# Patient Record
Sex: Female | Born: 1935 | Race: White | Hispanic: No | Marital: Single | Smoking: Current every day smoker
Health system: Southern US, Community
[De-identification: ages and names within clinical notes are randomized; demographics above are authoritative.]

## PROBLEM LIST (undated history)

## (undated) DIAGNOSIS — J9610 Chronic respiratory failure, unspecified whether with hypoxia or hypercapnia: Secondary | ICD-10-CM

## (undated) DIAGNOSIS — J449 Chronic obstructive pulmonary disease, unspecified: Secondary | ICD-10-CM

## (undated) DIAGNOSIS — I10 Essential (primary) hypertension: Secondary | ICD-10-CM

## (undated) DIAGNOSIS — I509 Heart failure, unspecified: Secondary | ICD-10-CM

## (undated) DIAGNOSIS — E785 Hyperlipidemia, unspecified: Secondary | ICD-10-CM

## (undated) DIAGNOSIS — H9190 Unspecified hearing loss, unspecified ear: Secondary | ICD-10-CM

## (undated) HISTORY — DX: Heart failure, unspecified: I50.9

## (undated) HISTORY — DX: Unspecified hearing loss, unspecified ear: H91.90

## (undated) HISTORY — PX: HIP SURGERY: SHX245

## (undated) HISTORY — DX: Essential (primary) hypertension: I10

---

## 2010-06-04 ENCOUNTER — Inpatient Hospital Stay: Payer: Self-pay | Admitting: Internal Medicine

## 2012-04-23 ENCOUNTER — Ambulatory Visit: Payer: Self-pay | Admitting: Specialist

## 2013-08-20 ENCOUNTER — Inpatient Hospital Stay: Payer: Self-pay | Admitting: Internal Medicine

## 2013-08-20 LAB — CBC
HCT: 40.1 % (ref 35.0–47.0)
HGB: 12.6 g/dL (ref 12.0–16.0)
MCH: 28.3 pg (ref 26.0–34.0)
MCHC: 31.5 g/dL — AB (ref 32.0–36.0)
MCV: 90 fL (ref 80–100)
Platelet: 254 10*3/uL (ref 150–440)
RBC: 4.47 10*6/uL (ref 3.80–5.20)
RDW: 12.8 % (ref 11.5–14.5)
WBC: 17 10*3/uL — AB (ref 3.6–11.0)

## 2013-08-20 LAB — BASIC METABOLIC PANEL
ANION GAP: 5 — AB (ref 7–16)
BUN: 19 mg/dL — ABNORMAL HIGH (ref 7–18)
CHLORIDE: 99 mmol/L (ref 98–107)
CO2: 33 mmol/L — AB (ref 21–32)
Calcium, Total: 9.4 mg/dL (ref 8.5–10.1)
Creatinine: 0.63 mg/dL (ref 0.60–1.30)
EGFR (African American): >60
EGFR (Non-African Amer.): >60
Glucose: 122 mg/dL — ABNORMAL HIGH (ref 65–99)
Osmolality: 277 (ref 275–301)
Potassium: 3.5 mmol/L (ref 3.5–5.1)
Sodium: 137 mmol/L (ref 136–145)

## 2013-08-20 LAB — URINALYSIS, COMPLETE
BILIRUBIN, UR: NEGATIVE
Glucose,UR: NEGATIVE mg/dL (ref 0–75)
Ketone: NEGATIVE
NITRITE: NEGATIVE
PH: 6 (ref 4.5–8.0)
Protein: 30
Specific Gravity: 1.01 (ref 1.003–1.030)

## 2013-08-20 LAB — TROPONIN I: Troponin-I: <0.02 ng/mL

## 2013-08-21 LAB — CBC WITH DIFFERENTIAL/PLATELET
BASOS ABS: 0.1 10*3/uL (ref 0.0–0.1)
Basophil %: 0.2 %
EOS ABS: 0 10*3/uL (ref 0.0–0.7)
EOS PCT: 0 %
HCT: 33.8 % — ABNORMAL LOW (ref 35.0–47.0)
HGB: 10.8 g/dL — ABNORMAL LOW (ref 12.0–16.0)
Lymphocyte #: 0.5 10*3/uL — ABNORMAL LOW (ref 1.0–3.6)
Lymphocyte %: 2.4 %
MCH: 28.5 pg (ref 26.0–34.0)
MCHC: 31.9 g/dL — ABNORMAL LOW (ref 32.0–36.0)
MCV: 89 fL (ref 80–100)
MONO ABS: 1 x10 3/mm — AB (ref 0.2–0.9)
Monocyte %: 4.6 %
Neutrophil #: 19.8 10*3/uL — ABNORMAL HIGH (ref 1.4–6.5)
Neutrophil %: 92.8 %
Platelet: 216 10*3/uL (ref 150–440)
RBC: 3.79 10*6/uL — ABNORMAL LOW (ref 3.80–5.20)
RDW: 13 % (ref 11.5–14.5)
WBC: 21.3 10*3/uL — ABNORMAL HIGH (ref 3.6–11.0)

## 2013-08-21 LAB — BASIC METABOLIC PANEL
Anion Gap: 8 (ref 7–16)
BUN: 28 mg/dL — ABNORMAL HIGH (ref 7–18)
CHLORIDE: 102 mmol/L (ref 98–107)
Calcium, Total: 8.5 mg/dL (ref 8.5–10.1)
Co2: 28 mmol/L (ref 21–32)
Creatinine: 0.82 mg/dL (ref 0.60–1.30)
EGFR (African American): >60
EGFR (Non-African Amer.): >60
Glucose: 140 mg/dL — ABNORMAL HIGH (ref 65–99)
OSMOLALITY: 283 (ref 275–301)
POTASSIUM: 3.8 mmol/L (ref 3.5–5.1)
Sodium: 138 mmol/L (ref 136–145)

## 2013-08-22 LAB — BASIC METABOLIC PANEL
Anion Gap: 6 — ABNORMAL LOW (ref 7–16)
Anion Gap: 5 — ABNORMAL LOW (ref 7–16)
BUN: 28 mg/dL — ABNORMAL HIGH (ref 7–18)
BUN: 29 mg/dL — AB (ref 7–18)
CALCIUM: 9.1 mg/dL (ref 8.5–10.1)
CHLORIDE: 98 mmol/L (ref 98–107)
CREATININE: 0.68 mg/dL (ref 0.60–1.30)
CREATININE: 0.66 mg/dL (ref 0.60–1.30)
Calcium, Total: 9.3 mg/dL (ref 8.5–10.1)
Chloride: 99 mmol/L (ref 98–107)
Co2: 33 mmol/L — ABNORMAL HIGH (ref 21–32)
Co2: 34 mmol/L — ABNORMAL HIGH (ref 21–32)
EGFR (African American): >60
EGFR (African American): >60
EGFR (Non-African Amer.): >60
EGFR (Non-African Amer.): >60
GLUCOSE: 149 mg/dL — AB (ref 65–99)
Glucose: 142 mg/dL — ABNORMAL HIGH (ref 65–99)
OSMOLALITY: 284 (ref 275–301)
Osmolality: 282 (ref 275–301)
POTASSIUM: 4.2 mmol/L (ref 3.5–5.1)
POTASSIUM: 3.8 mmol/L (ref 3.5–5.1)
SODIUM: 137 mmol/L (ref 136–145)
Sodium: 138 mmol/L (ref 136–145)

## 2013-08-22 LAB — CBC WITH DIFFERENTIAL/PLATELET
BASOS ABS: 0 10*3/uL (ref 0.0–0.1)
Basophil %: 0.1 %
EOS ABS: 0 10*3/uL (ref 0.0–0.7)
Eosinophil %: 0 %
HCT: 31.7 % — ABNORMAL LOW (ref 35.0–47.0)
HGB: 10.4 g/dL — ABNORMAL LOW (ref 12.0–16.0)
Lymphocyte #: 0.5 10*3/uL — ABNORMAL LOW (ref 1.0–3.6)
Lymphocyte %: 2.3 %
MCH: 29.1 pg (ref 26.0–34.0)
MCHC: 32.9 g/dL (ref 32.0–36.0)
MCV: 89 fL (ref 80–100)
MONOS PCT: 4 %
Monocyte #: 0.9 x10 3/mm (ref 0.2–0.9)
Neutrophil #: 20.3 10*3/uL — ABNORMAL HIGH (ref 1.4–6.5)
Neutrophil %: 93.6 %
Platelet: 213 10*3/uL (ref 150–440)
RBC: 3.58 10*6/uL — ABNORMAL LOW (ref 3.80–5.20)
RDW: 12.9 % (ref 11.5–14.5)
WBC: 21.7 10*3/uL — ABNORMAL HIGH (ref 3.6–11.0)

## 2013-08-22 LAB — MAGNESIUM: Magnesium: 1.9 mg/dL

## 2013-08-22 LAB — URINE CULTURE

## 2013-08-23 LAB — CBC WITH DIFFERENTIAL/PLATELET
Basophil #: 0 10*3/uL (ref 0.0–0.1)
Basophil %: 0.1 %
EOS ABS: 0 10*3/uL (ref 0.0–0.7)
Eosinophil %: 0 %
HCT: 31.3 % — AB (ref 35.0–47.0)
HGB: 10.6 g/dL — ABNORMAL LOW (ref 12.0–16.0)
Lymphocyte #: 0.9 10*3/uL — ABNORMAL LOW (ref 1.0–3.6)
Lymphocyte %: 4 %
MCH: 29.7 pg (ref 26.0–34.0)
MCHC: 33.8 g/dL (ref 32.0–36.0)
MCV: 88 fL (ref 80–100)
MONOS PCT: 10.7 %
Monocyte #: 2.5 x10 3/mm — ABNORMAL HIGH (ref 0.2–0.9)
NEUTROS ABS: 20.1 10*3/uL — AB (ref 1.4–6.5)
Neutrophil %: 85.2 %
PLATELETS: 236 10*3/uL (ref 150–440)
RBC: 3.56 10*6/uL — AB (ref 3.80–5.20)
RDW: 12.8 % (ref 11.5–14.5)
WBC: 23.6 10*3/uL — ABNORMAL HIGH (ref 3.6–11.0)

## 2014-06-14 NOTE — H&P (Signed)
PATIENT NAME:  Brenda Higgins, VANHOUTEN MR#:  [id number] DATE OF BIRTH:  [birth year]  DATE OF ADMISSION:  08/20/2013.  REFERRING PHYSICIAN:  Dorothea Glassman, MD  PRIMARY CARE PHYSICIAN:  Barry Brunner, MD    PRIMARY PULMONOLOGIST:  Clenton Pare. Meredeth Ide, MD  CHIEF COMPLAINT:  Fall and hip pain.   HISTORY OF PRESENT ILLNESS:  The patient is a pleasant 79 year old female with chronic respiratory failure due to COPD.  The patient had a fall today and sustained a right hip fracture and we are asked to admit her. Again, the patient, of note, has COPD, which is advanced and she uses chronic oxygen. The patient has no significant chest pains. The patient is quite functional at home and her shortness of breath and COPD kind of limit her mobility but she has no history of MI or diabetes. She does chores and housework around the house, such as washing and cleaning without any chest pain and can walk 1 block.  Of note, in the last week or so she has picked up smoking again after her family was in town and she has somewhat increased anxiety. The patient also has some increased greenish sputum without increase in shortness of breath or wheezing from her baseline. The patient, however, does have symptoms of a UTI.  No fevers.   PAST MEDICAL HISTORY: Hypertension, chronic respiratory failure, on 2 liters of oxygen, COPD, hyperlipidemia.   OUTPATIENT MEDICATIONS: Spiriva 18 mcg inhaled daily, 1 capsule.  Advair 100/50 mcg 1 puff 2 times a day, amlodipine 5 mg daily, aspirin 81 mg daily, hydrochlorothiazide 12.5 mg daily, lovastatin 40 mg daily, vitamin with minerals once a day.   ALLERGIES: No known drug allergies.   SOCIAL HISTORY: Had quit smoking in the past but recently picked it up due to increased anxiety but smokes only occasionally. No alcohol or drug use. Her son lives with her.  FAMILY HISTORY: Mother died of MI. Dad had MI and diabetes.   REVIEW OF SYSTEMS: CONSTITUTIONAL: Denies having any fevers or chills. Has  increased weakness.  EYES: No blurry vision or double vision.  ENT: Denies tinnitus, or hearing loss.  RESPIRATORY: Has a cough with some greenish sputum, as above. Has COPD. No increased shortness of breath.  CARDIOVASCULAR: No chest pains or palpitations. No syncope or CHF or increased swelling in the legs.  GASTROINTESTINAL: No nausea, vomiting, diarrhea, abdominal pain.  GENITOURINARY: Has dysuria.  ENDOCRINE: Denies polyuria, nocturia.  HEMATOLOGIC/LYMPHATIC: Denies anemia or easy bruising.  SKIN: No rashes.  MUSCULOSKELETAL: Has some hip pain on the right.  NEUROLOGIC:  No focal weakness or numbness.  PSYCHIATRIC: Has anxiety.   PHYSICAL EXAMINATION:  VITAL SIGNS:  On arrival, temperature 98.1, pulse rate 92, respiratory rate 18, blood pressure 101/68, oxygen saturation 86% on oxygen while I was in the room. She is on the same oxygen and saturation was 92%.  GENERAL: The patient is an elderly Caucasian female, pretty hard of hearing, lying in bed in no obvious distress.  HEENT: Normocephalic, atraumatic. Pupils are equal and reactive. Moist mucous membranes. A little whitish film on around her tongue.  NECK: Supple. No thyroid tenderness. No cervical lymphadenopathy.  CARDIOVASCULAR: S1, S2 regular. No significant murmurs, rubs, or gallops.  LUNGS: Somewhat diminished breath sounds without significant wheezing, rhonchi, or rales.  ABDOMEN: Soft, nontender, nondistended. Positive bowel sounds in all quadrants.  EXTREMITIES: No pitting edema.  SKIN: No obvious rashes or lesions.   NEUROLOGIC:  Cranial nerves II-XII grossly intact. Strength is 5/5 in  the upper extremities, did not test lower extremities due to the hip fracture.  PSYCHIATRIC: Awake, alert, oriented x3.   IMAGING:  Chest x-ray, PA and lateral shows no evidence of acute cardiopulmonary disease.   Right hip, complete, shows a minimally displaced intertrochanteric right hip fracture   LABORATORY DATA:  Urinalysis showed  3+ blood, There are no nitrites, but 3+ leukocyte esterase, 15-30 WBC, 2+ bacteria.   White count was 17, hemoglobin 12.6, platelets 254,000, troponin negative. BUN 19, creatinine 0.63, sodium 137, potassium 3.5.   EKG: Sinus rhythm with some fusion complexes.  Do not see any acute ST elevations or depressions. Nonspecific ST abnormalities.   ASSESSMENT AND PLAN: The patient is a 79 year old with chronic respiratory failure due to chronic obstructive pulmonary disease, hypertension, status post fall today and a right hip fracture.   In terms of pre-op evaluation and clearance, the patient has no recent MI or CHF. The patient has at least 4 metabolic equivalents.  She can do housework and walk 1 block, with the limiting factor being her shortness of breath without any chest pains. The patient has no increased shortness of breath at baseline but does have a more productive cough recently, for the past week or so. At this point, I would recommend proceeding to surgery without any further cardiac work-up. With regard to her breathing I would start her on some around-the-clock nebulizers for now, resume Advair and Spiriva, start incentive spirometry, obtain sputum cultures. She does appear to have a urinary tract infection, as well, has positive urinary symptoms and mild leukocytosis. I suspect the leukocytosis is likely secondary to a urinary tract infection. An acute bronchitis-type picture is not completely excluded as she has had increased production of sputum, which is new for her. We will start her on ceftriaxone which should treat both of them. Will continue oxygen, but will not start her on steroids at this point as she is not significantly wheezing and her oxygen saturation while I was in the room were acceptable on her 2 liters of oxygen. Her blood pressure is a little soft, I will hold her hydrochlorothyazide and amlodipine and at this point,   I have discussed the case with Dr. Rosita Kea who will be  taking the patient to the OR. The plan, given her chronic respiratory failure and chronic obstructive pulmonary disease issues, is to do spinal anesthesia. Would follow her urine and sputum cultures.  Morphine for pain, continue her statin.   CODE STATUS: Full code.   TOTAL TIME SPENT: About 45 minutes.    ____________________________ Krystal Eaton, MD sa:lt D: 08/20/2013 11:09:43 ET T: 08/20/2013 12:05:23 ET JOB#: [id number]  cc: Krystal Eaton, MD, <Dictator> Jorje Guild. Beckey Downing, MD Herbon E. Meredeth Ide, MD Krystal Eaton MD ELECTRONICALLY SIGNED 08/21/2013 14:09

## 2014-06-14 NOTE — Consult Note (Signed)
PATIENT NAME:  Brenda Higgins, Brenda Higgins MR#:  [id number] DATE OF BIRTH:  02-27-1935  DATE OF CONSULTATION:  08/23/2013  REFERRING PHYSICIAN:  Dr. Cecilie Lowers  CONSULTING PHYSICIAN:  Lamar Blinks, MD  REASON FOR CONSULTATION: Atrial fibrillation, hypertension, and chronic obstructive pulmonary disease.   CHIEF COMPLAINT: The patient is short of breath.   HISTORY OF PRESENT ILLNESS: This 79 year old female with significant exacerbation of chronic obstructive pulmonary disease and bronchitis, with elevated white blood cell count and hypoxia waxing and waning, but slowly improving over the last several days on antibiotics and prednisone. The patient had worsening shortness of breath and an episode of atrial fibrillation with controlled ventricular rate, but now has spontaneously converted back to normal rhythm due to improvement of overall symptoms. The patient has had continued hypertension control and did have some pulmonary edema, which has been helped with Lasix. She now is mildly short of breath, but no evidence of tachycardia, heart failure and/or angina.   REVIEW OF SYSTEMS:  Negative for vision change, ringing in the ears, hearing loss, cough, heartburn, nausea, vomiting, diarrhea, bloody stools, stomach pain, extremity pain, leg weakness, cramping of the buttocks, known blood clots, headaches, blackouts, dizzy spells, nosebleeds, congestion, trouble swallowing, frequent urination, urination at night, muscle weakness, numbness, anxiety, depression, skin lesions, or skin rashes.   PAST MEDICAL HISTORY:  1.  Chronic obstructive pulmonary disease.  2.  Hypertension.   FAMILY HISTORY: Sometimes family members with early onset of cardiovascular disease.   SOCIAL HISTORY: Currently denies alcohol or tobacco use.   ALLERGIES:  AS LISTED.  MEDICATIONS: As listed.   PHYSICAL EXAMINATION:  VITAL SIGNS: Blood pressure is 110/68 bilaterally. Heart rate 72 upright, reclining, and regular.  GENERAL: She is a  well-appearing elderly female in no acute distress.  HEAD, EYES, EARS, NOSE, AND THROAT: No icterus, thyromegaly, ulcers, hemorrhage, or xanthelasma.  CARDIOVASCULAR: Regular rate and rhythm. Normal S1 and S2. No murmur, gallop, or rub. PMI is diffuse. Carotid upstroke is normal without bruit. Jugular venous pressure is normal.  LUNGS: Diffuse rhonchi and wheezes.  ABDOMEN: Soft, nontender, without hepatosplenomegaly or masses. Abdominal aorta is normal size without bruit.  EXTREMITIES: Trace dorsal pedal 2+ femoral, 2+ radial artery pulses. No cyanosis, clubbing or ulcers.  NEUROLOGIC: She is somewhat disoriented.   ASSESSMENT: This is a 79 year old female with hypertension, acute on chronic obstructive pulmonary disease exacerbation with atrial fibrillation, now spontaneously converted to normal rhythm, likely secondary to current illness and possible hypoxia.   RECOMMENDATIONS:  1.  Aspirin for further risk reduction in stroke with intermittent atrial fibrillation  and no anticoagulation due to maintenance of normal rhythm and concerns of fall risk.  2.  No addition of beta blocker at this time due to no concerns of atrial fibrillation, although if it recurs during this hospitalization, would add metoprolol at 25 mg.   3.  No further cardiac intervention or diagnostics necessary at this time.   4.  Continue to follow closely for evidence of further symptoms with ambulation.   ____________________________ Lamar Blinks, MD bjk:ts D: 08/23/2013 09:30:18 ET T: 08/23/2013 11:13:17 ET JOB#: [id number]  cc: Lamar Blinks, MD, <Dictator> Lamar Blinks MD ELECTRONICALLY SIGNED 08/26/2013 13:22

## 2014-06-14 NOTE — Consult Note (Signed)
Brief Consult Note: Diagnosis: right intertrochanteric hip fracture.   Patient was seen by consultant.   Recommend to proceed with surgery or procedure.   Orders entered.   Discussed with Attending MD.   Comments: plan cephalomedualrry nail later today. Spinal anesthesia with her severe COPD.  Electronic Signatures: Leitha Schuller (MD)  (Signed 30-Jun-15 11:41)  Authored: Brief Consult Note   Last Updated: 30-Jun-15 11:41 by Leitha Schuller (MD)

## 2014-06-14 NOTE — Op Note (Signed)
PATIENT NAME:  Brenda Higgins, Brenda Higgins MR#:  [id number] DATE OF BIRTH:  16-Nov-1935  DATE OF PROCEDURE:  08/20/2013  PREOPERATIVE DIAGNOSIS: Right intertrochanteric hip fracture.   POSTOPERATIVE DIAGNOSIS: Right intertrochanteric hip fracture.   PROCEDURE: ORIF, right hip, with cephalomedullary rod.   ANESTHESIA: Spinal.   SURGEON: Kennedy Bucker, MD   DESCRIPTION OF PROCEDURE: The patient was brought to the operating room, and after adequate anesthesia was obtained, the patient was placed on the fracture table, left leg in the well leg holder, right foot in the traction boot.  C-arm was brought in and good visualization in both AP and lateral projections obtained. The hip was prepped and draped using the barrier drape method, appropriate patient identification, timeout procedures were completed.  Proximal incision was made and a guidewire inserted into a center/center position at the lateralized tip of the greater trochanter. Proximal reaming was  carried out with flexible reamer  a long guidewire inserted and length determined for the rod . Reaming was carried up to 13 mm, and an 11 x 360 right Affixus fracture nail was inserted down the canal without difficulty. The lateral incision was made, and through the guide, guidewire inserted into a center/center position on the head, measured, drilled, and then a 10.5 x 95 mm lag screw was inserted. Proximal locking set screw set and the insertion handle removed.  Next, going distally, perfect circles were seen at the distal end of the rod. Small incision made, drilling, measuring, and placing a 5.0 cortical screw, 42 mm in length, gave distal fixation. All wounds were thoroughly irrigated and the wound closed with #1 Vicryl for the deep fascia, 2-0 Vicryl subcutaneously, and skin staples.   ESTIMATED BLOOD LOSS:   25.  COMPLICATIONS: None.   SPECIMEN: None.   IMPLANTS: Biomet 11 x 360 right Affixus fracture nail, 95 mm lag screw, 42 mm bone screw.  CONDITION  IN RECOVERY ROOM:  Stable.   ____________________________ Leitha Schuller, MD mjm:dd D: 08/20/2013 20:16:39 ET T: 08/21/2013 03:05:47 ET JOB#: [id number]  cc: Leitha Schuller, MD, <Dictator> Leitha Schuller MD ELECTRONICALLY SIGNED 08/21/2013 6:07

## 2014-06-14 NOTE — Discharge Summary (Signed)
PATIENT NAME:  Brenda Higgins, Brenda Higgins MR#:  [id number] DATE OF BIRTH:  [birth year]  DATE OF ADMISSION:  08/20/2013 DATE OF DISCHARGE:  08/23/2013  ADMISSION DIAGNOSES:  1.  Fall, resulting in a right intertrochanteric hip fracture.  2.  Acute on chronic respiratory failure.  3.  Acute chronic obstructive pulmonary disease exacerbation.  4.  Acute diastolic heart failure.   DISCHARGE DIAGNOSES:  Leukocytosis, steroid-induced.   CONSULTATIONS:  Orthopedics.   PROCEDURES: The patient is status post ORIF of the right hip on 08/22/2013.   LABORATORIES AT DISCHARGE: White blood cells 23, hemoglobin 10.6, hematocrit 31.3, platelets are 236. Magnesium 1.9. Sodium 137, potassium 3.8, chloride 98, bicarb 34, BUN 29, creatinine 0.66. Glucose is 142.    A 2-D echocardiogram showed an EF of 65% to 70%, with impaired relaxation pattern of LV diastolic dysfunction.   HOSPITAL COURSE: A 79 year old female who is status post a fall, suffered a hip fracture, underwent open reduction and internal fixation on the 30th of June, subsequently developed respiratory failure after the procedure. For further details, please refer to the H and P.  1.  Acute on chronic respiratory failure after the procedure secondary to chronic obstructive pulmonary disease, as well as acute diastolic dysfunction. The patient was treated for her COPD exacerbation with IV steroids and IV Lasix for her acute diastolic heart failure. Echo was consistent with diastolic heart failure. Her EF was 65 to 70%. She was initially placed on BiPAP. This was transitioned to her home oxygen. She is on 4 liters of oxygen currently. We are weaning her steroids currently.  2.  Chronic obstructive pulmonary disease exacerbation after a procedure. The patient has been on IV steroids, which is weaned to p.o.  3.  Postoperative day #3 of  open reduction and internal fixation per orthopedics. The patient will need rehab at discharge.  4.  Hypertension. The patient will  continue on Norvasc.  5.  Escherichia coli urinary tract infection. The patient was on Rocephin, changed to Keflex at discharge.  6.  Leukocytosis. The patient has no fevers. I suspect this is steroid induced. She did present with elevated white blood cell count initially from her fall and fracture, but after we gave IV steroids, it did increase. She will need close followup for this with a repeat CBC in 2 days.   DISCHARGE MEDICATIONS: 1.  Lovastatin 40 mg at bedtime.  2.  Advair Diskus 100/50 b.i.d.  3.  Spiriva 18 mcg daily.  4.  Norvasc 5 mg daily.  5.  One-A-Day daily.  6.  Acetaminophen 325, 2 tablets q.4 hours p.r.n. pain or temperature.  7.  Acetaminophen/hydrocodone 325/5 q.4-6 hours p.r.n. pain.  8.  Enoxaparin or Lovenox 30 mg subQ daily for 14 days.  9.  Ferrous sulfate 325 b.i.d.  10.  Docusate 240 mg at bedtime.  11.  Ensure b.i.d.  12.  Keflex 500 mg p.o. q.8 hours x 7 days.  13.  Lasix 20 mg daily.  14.  Prednisone taper starting at 60 mg, taper by 10 mg every 2 days.  15.  The patient may resume aspirin 81 mg daily after her Lovenox.   O2 4 liters  DISCHARGE DIET:  Low sodium.  DISCHARGE ACTIVITY:  As tolerated.   DISCHARGE REFERRAL: Physical therapy.   DISCHARGE OXYGEN:  Four liters nasal cannula.   The patient needs a CBC in 2 days. The patient is stable for discharge.   TIME SPENT: 40 minutes.    ____________________________ Janyth Contes.  Juliene Pina, MD spm:dmm D: 08/23/2013 10:37:26 ET T: 08/23/2013 10:58:01 ET JOB#: [id number]  cc: Jamont Mellin P. Juliene Pina, MD, <Dictator> Richard L. Sullivan Lone, MD Leitha Schuller, MD Janyth Contes Aritzel Krusemark MD ELECTRONICALLY SIGNED 08/23/2013 11:52

## 2014-09-04 ENCOUNTER — Inpatient Hospital Stay
Admit: 2014-09-04 | Discharge: 2014-09-04 | Disposition: A | Discharge status: Home / Self Care | Payer: Medicare Other | Attending: Internal Medicine | Admitting: Internal Medicine

## 2014-09-04 ENCOUNTER — Inpatient Hospital Stay: Payer: Medicare Other

## 2014-09-04 ENCOUNTER — Inpatient Hospital Stay
Admission: EM | Admit: 2014-09-04 | Discharge: 2014-09-08 | DRG: 480 | Disposition: A | Discharge status: Skilled Nursing Facility | Payer: Medicare Other | Attending: Internal Medicine | Admitting: Internal Medicine

## 2014-09-04 ENCOUNTER — Emergency Department: Payer: Medicare Other

## 2014-09-04 ENCOUNTER — Encounter: Payer: Self-pay | Admitting: Emergency Medicine

## 2014-09-04 DIAGNOSIS — T380X5A Adverse effect of glucocorticoids and synthetic analogues, initial encounter: Secondary | ICD-10-CM | POA: Diagnosis present

## 2014-09-04 DIAGNOSIS — Z9889 Other specified postprocedural states: Secondary | ICD-10-CM

## 2014-09-04 DIAGNOSIS — R0902 Hypoxemia: Secondary | ICD-10-CM

## 2014-09-04 DIAGNOSIS — J9622 Acute and chronic respiratory failure with hypercapnia: Secondary | ICD-10-CM | POA: Diagnosis present

## 2014-09-04 DIAGNOSIS — F439 Reaction to severe stress, unspecified: Secondary | ICD-10-CM | POA: Diagnosis present

## 2014-09-04 DIAGNOSIS — I501 Left ventricular failure: Secondary | ICD-10-CM | POA: Diagnosis present

## 2014-09-04 DIAGNOSIS — I10 Essential (primary) hypertension: Secondary | ICD-10-CM | POA: Diagnosis present

## 2014-09-04 DIAGNOSIS — D72829 Elevated white blood cell count, unspecified: Secondary | ICD-10-CM | POA: Diagnosis present

## 2014-09-04 DIAGNOSIS — J9621 Acute and chronic respiratory failure with hypoxia: Secondary | ICD-10-CM | POA: Diagnosis present

## 2014-09-04 DIAGNOSIS — S72142A Displaced intertrochanteric fracture of left femur, initial encounter for closed fracture: Secondary | ICD-10-CM | POA: Diagnosis present

## 2014-09-04 DIAGNOSIS — W1830XA Fall on same level, unspecified, initial encounter: Secondary | ICD-10-CM | POA: Diagnosis present

## 2014-09-04 DIAGNOSIS — E785 Hyperlipidemia, unspecified: Secondary | ICD-10-CM | POA: Diagnosis present

## 2014-09-04 DIAGNOSIS — Z8249 Family history of ischemic heart disease and other diseases of the circulatory system: Secondary | ICD-10-CM | POA: Diagnosis not present

## 2014-09-04 DIAGNOSIS — D62 Acute posthemorrhagic anemia: Secondary | ICD-10-CM | POA: Diagnosis present

## 2014-09-04 DIAGNOSIS — S72009A Fracture of unspecified part of neck of unspecified femur, initial encounter for closed fracture: Secondary | ICD-10-CM | POA: Diagnosis present

## 2014-09-04 DIAGNOSIS — J9600 Acute respiratory failure, unspecified whether with hypoxia or hypercapnia: Secondary | ICD-10-CM

## 2014-09-04 DIAGNOSIS — J9620 Acute and chronic respiratory failure, unspecified whether with hypoxia or hypercapnia: Secondary | ICD-10-CM

## 2014-09-04 DIAGNOSIS — J441 Chronic obstructive pulmonary disease with (acute) exacerbation: Secondary | ICD-10-CM

## 2014-09-04 DIAGNOSIS — E872 Acidosis: Secondary | ICD-10-CM | POA: Diagnosis present

## 2014-09-04 DIAGNOSIS — W19XXXA Unspecified fall, initial encounter: Secondary | ICD-10-CM

## 2014-09-04 DIAGNOSIS — Z9981 Dependence on supplemental oxygen: Secondary | ICD-10-CM | POA: Diagnosis not present

## 2014-09-04 DIAGNOSIS — I340 Nonrheumatic mitral (valve) insufficiency: Secondary | ICD-10-CM | POA: Diagnosis present

## 2014-09-04 DIAGNOSIS — Z419 Encounter for procedure for purposes other than remedying health state, unspecified: Secondary | ICD-10-CM

## 2014-09-04 DIAGNOSIS — I5033 Acute on chronic diastolic (congestive) heart failure: Secondary | ICD-10-CM | POA: Diagnosis present

## 2014-09-04 DIAGNOSIS — F17210 Nicotine dependence, cigarettes, uncomplicated: Secondary | ICD-10-CM | POA: Diagnosis present

## 2014-09-04 DIAGNOSIS — S72002A Fracture of unspecified part of neck of left femur, initial encounter for closed fracture: Secondary | ICD-10-CM

## 2014-09-04 DIAGNOSIS — I5030 Unspecified diastolic (congestive) heart failure: Secondary | ICD-10-CM

## 2014-09-04 HISTORY — DX: Chronic respiratory failure, unspecified whether with hypoxia or hypercapnia: J96.10

## 2014-09-04 HISTORY — DX: Hyperlipidemia, unspecified: E78.5

## 2014-09-04 HISTORY — DX: Chronic obstructive pulmonary disease, unspecified: J44.9

## 2014-09-04 LAB — CBC
HEMATOCRIT: 38.6 % (ref 35.0–47.0)
HEMOGLOBIN: 12.4 g/dL (ref 12.0–16.0)
MCH: 28.1 pg (ref 26.0–34.0)
MCHC: 32.1 g/dL (ref 32.0–36.0)
MCV: 87.6 fL (ref 80.0–100.0)
Platelets: 236 10*3/uL (ref 150–440)
RBC: 4.41 MIL/uL (ref 3.80–5.20)
RDW: 13.8 % (ref 11.5–14.5)
WBC: 13.4 10*3/uL — ABNORMAL HIGH (ref 3.6–11.0)

## 2014-09-04 LAB — COMPREHENSIVE METABOLIC PANEL
ALT: 27 U/L (ref 14–54)
AST: 28 U/L (ref 15–41)
Albumin: 3.7 g/dL (ref 3.5–5.0)
Alkaline Phosphatase: 75 U/L (ref 38–126)
Anion gap: 8 (ref 5–15)
BUN: 21 mg/dL — ABNORMAL HIGH (ref 6–20)
CALCIUM: 9.8 mg/dL (ref 8.9–10.3)
CHLORIDE: 95 mmol/L — AB (ref 101–111)
CO2: 36 mmol/L — AB (ref 22–32)
Creatinine, Ser: 0.42 mg/dL — ABNORMAL LOW (ref 0.44–1.00)
GFR calc Af Amer: >60 mL/min (ref >60)
GFR calc non Af Amer: >60 mL/min (ref >60)
Glucose, Bld: 125 mg/dL — ABNORMAL HIGH (ref 65–99)
Potassium: 3.5 mmol/L (ref 3.5–5.1)
SODIUM: 139 mmol/L (ref 135–145)
Total Bilirubin: 0.3 mg/dL (ref 0.3–1.2)
Total Protein: 7 g/dL (ref 6.5–8.1)

## 2014-09-04 LAB — TROPONIN I
Troponin I: <0.03 ng/mL (ref <0.031)
Troponin I: <0.03 ng/mL (ref <0.031)
Troponin I: <0.03 ng/mL (ref <0.031)
Troponin I: <0.03 ng/mL (ref <0.031)

## 2014-09-04 LAB — MRSA PCR SCREENING: MRSA by PCR: NEGATIVE

## 2014-09-04 LAB — TSH: TSH: 1.28 u[IU]/mL (ref 0.350–4.500)

## 2014-09-04 LAB — GLUCOSE, CAPILLARY: Glucose-Capillary: 229 mg/dL — ABNORMAL HIGH (ref 65–99)

## 2014-09-04 LAB — HEMOGLOBIN A1C: Hgb A1c MFr Bld: 5.9 % (ref 4.0–6.0)

## 2014-09-04 MED ORDER — HYDROCODONE-ACETAMINOPHEN 5-325 MG PO TABS
1.0000 | ORAL_TABLET | Freq: Four times a day (QID) | ORAL | Status: DC | PRN
  Administered 2014-09-04 – 2014-09-06 (×3): 1 via ORAL
  Administered 2014-09-07 – 2014-09-08 (×3): 2 via ORAL
  Filled 2014-09-04: qty 2
  Filled 2014-09-04: qty 1
  Filled 2014-09-04: qty 2
  Filled 2014-09-04: qty 1
  Filled 2014-09-04: qty 2
  Filled 2014-09-04: qty 1

## 2014-09-04 MED ORDER — BUDESONIDE 0.5 MG/2ML IN SUSP
0.5000 mg | Freq: Two times a day (BID) | RESPIRATORY_TRACT | Status: AC
  Administered 2014-09-04 – 2014-09-07 (×6): 0.5 mg via RESPIRATORY_TRACT
  Filled 2014-09-04 (×7): qty 2

## 2014-09-04 MED ORDER — BUDESONIDE 0.25 MG/2ML IN SUSP: 0.2500 mg | Freq: Two times a day (BID) | RESPIRATORY_TRACT | Status: DC

## 2014-09-04 MED ORDER — MORPHINE SULFATE 2 MG/ML IJ SOLN
2.0000 mg | Freq: Once | INTRAMUSCULAR | Status: AC
  Administered 2014-09-04: 2 mg via INTRAVENOUS
  Filled 2014-09-04: qty 1

## 2014-09-04 MED ORDER — TIOTROPIUM BROMIDE MONOHYDRATE 18 MCG IN CAPS
18.0000 ug | ORAL_CAPSULE | Freq: Every day | RESPIRATORY_TRACT | Status: DC
  Administered 2014-09-04: 18 ug via RESPIRATORY_TRACT
  Filled 2014-09-04: qty 5

## 2014-09-04 MED ORDER — CEFAZOLIN (ANCEF) 1 G IV SOLR
1.0000 g | INTRAVENOUS | Status: DC
  Filled 2014-09-04 (×2): qty 1

## 2014-09-04 MED ORDER — ALBUTEROL SULFATE (2.5 MG/3ML) 0.083% IN NEBU: 2.5000 mg | INHALATION_SOLUTION | RESPIRATORY_TRACT | Status: DC | PRN

## 2014-09-04 MED ORDER — ACETAMINOPHEN 650 MG RE SUPP: 650.0000 mg | Freq: Four times a day (QID) | RECTAL | Status: DC | PRN

## 2014-09-04 MED ORDER — IPRATROPIUM-ALBUTEROL 0.5-2.5 (3) MG/3ML IN SOLN: 3.0000 mL | RESPIRATORY_TRACT | Status: AC | PRN

## 2014-09-04 MED ORDER — ALBUTEROL SULFATE (2.5 MG/3ML) 0.083% IN NEBU: 2.5000 mg | INHALATION_SOLUTION | Freq: Four times a day (QID) | RESPIRATORY_TRACT | Status: DC

## 2014-09-04 MED ORDER — ONDANSETRON HCL 4 MG/2ML IJ SOLN
4.0000 mg | Freq: Once | INTRAMUSCULAR | Status: AC
  Administered 2014-09-04: 4 mg via INTRAVENOUS

## 2014-09-04 MED ORDER — MORPHINE SULFATE 2 MG/ML IJ SOLN
INTRAMUSCULAR | Status: AC
  Administered 2014-09-04: 2 mg via INTRAVENOUS
  Filled 2014-09-04: qty 1

## 2014-09-04 MED ORDER — LEVOFLOXACIN IN D5W 750 MG/150ML IV SOLN
750.0000 mg | INTRAVENOUS | Status: DC
  Administered 2014-09-04: 750 mg via INTRAVENOUS
  Filled 2014-09-04: qty 150

## 2014-09-04 MED ORDER — MORPHINE SULFATE 2 MG/ML IJ SOLN
2.0000 mg | INTRAMUSCULAR | Status: DC | PRN
  Administered 2014-09-04 – 2014-09-06 (×4): 2 mg via INTRAVENOUS
  Filled 2014-09-04 (×4): qty 1

## 2014-09-04 MED ORDER — IPRATROPIUM-ALBUTEROL 0.5-2.5 (3) MG/3ML IN SOLN
3.0000 mL | Freq: Four times a day (QID) | RESPIRATORY_TRACT | Status: AC
  Administered 2014-09-04 – 2014-09-06 (×9): 3 mL via RESPIRATORY_TRACT
  Filled 2014-09-04 (×9): qty 3

## 2014-09-04 MED ORDER — TIOTROPIUM BROMIDE MONOHYDRATE 18 MCG IN CAPS
18.0000 ug | ORAL_CAPSULE | Freq: Every day | RESPIRATORY_TRACT | Status: DC
  Administered 2014-09-07 – 2014-09-08 (×2): 18 ug via RESPIRATORY_TRACT
  Filled 2014-09-04: qty 5

## 2014-09-04 MED ORDER — DIPHENHYDRAMINE HCL 50 MG/ML IJ SOLN
25.0000 mg | Freq: Once | INTRAMUSCULAR | Status: AC
  Administered 2014-09-04: 25 mg via INTRAVENOUS
  Filled 2014-09-04: qty 1

## 2014-09-04 MED ORDER — SODIUM CHLORIDE 0.9 % IV BOLUS (SEPSIS)
500.0000 mL | Freq: Once | INTRAVENOUS | Status: AC
  Administered 2014-09-04: 500 mL via INTRAVENOUS

## 2014-09-04 MED ORDER — MORPHINE SULFATE 2 MG/ML IJ SOLN
2.0000 mg | INTRAMUSCULAR | Status: DC | PRN
  Administered 2014-09-04: 2 mg via INTRAVENOUS
  Filled 2014-09-04 (×2): qty 1

## 2014-09-04 MED ORDER — IPRATROPIUM-ALBUTEROL 0.5-2.5 (3) MG/3ML IN SOLN
3.0000 mL | Freq: Once | RESPIRATORY_TRACT | Status: AC
  Administered 2014-09-04: 3 mL via RESPIRATORY_TRACT

## 2014-09-04 MED ORDER — CEFTRIAXONE SODIUM 1 G IJ SOLR
1.0000 g | INTRAMUSCULAR | Status: DC
  Administered 2014-09-04 – 2014-09-07 (×4): 1 g via INTRAVENOUS
  Filled 2014-09-04 (×5): qty 10

## 2014-09-04 MED ORDER — POTASSIUM CHLORIDE 20 MEQ PO PACK
20.0000 meq | PACK | Freq: Every day | ORAL | Status: DC
  Administered 2014-09-04: 20 meq via ORAL
  Filled 2014-09-04: qty 1

## 2014-09-04 MED ORDER — METHYLPREDNISOLONE SODIUM SUCC 40 MG IJ SOLR
40.0000 mg | Freq: Two times a day (BID) | INTRAMUSCULAR | Status: DC
  Administered 2014-09-04 – 2014-09-06 (×5): 40 mg via INTRAVENOUS
  Filled 2014-09-04 (×6): qty 1

## 2014-09-04 MED ORDER — IPRATROPIUM-ALBUTEROL 0.5-2.5 (3) MG/3ML IN SOLN: 3.0000 mL | Freq: Once | RESPIRATORY_TRACT | Status: AC

## 2014-09-04 MED ORDER — IPRATROPIUM-ALBUTEROL 0.5-2.5 (3) MG/3ML IN SOLN
RESPIRATORY_TRACT | Status: AC
  Administered 2014-09-04: 3 mL via RESPIRATORY_TRACT
  Filled 2014-09-04: qty 6

## 2014-09-04 MED ORDER — IPRATROPIUM-ALBUTEROL 0.5-2.5 (3) MG/3ML IN SOLN
RESPIRATORY_TRACT | Status: AC
  Administered 2014-09-04: 3 mL via RESPIRATORY_TRACT
  Filled 2014-09-04: qty 3

## 2014-09-04 MED ORDER — ALBUTEROL SULFATE (2.5 MG/3ML) 0.083% IN NEBU
2.5000 mg | INHALATION_SOLUTION | RESPIRATORY_TRACT | Status: DC
  Administered 2014-09-04: 2.5 mg via RESPIRATORY_TRACT
  Filled 2014-09-04: qty 3

## 2014-09-04 MED ORDER — MORPHINE SULFATE 2 MG/ML IJ SOLN
2.0000 mg | Freq: Once | INTRAMUSCULAR | Status: AC
  Administered 2014-09-04: 2 mg via INTRAVENOUS

## 2014-09-04 MED ORDER — IPRATROPIUM-ALBUTEROL 0.5-2.5 (3) MG/3ML IN SOLN
3.0000 mL | Freq: Once | RESPIRATORY_TRACT | Status: AC
  Administered 2014-09-04 (×2): 3 mL via RESPIRATORY_TRACT

## 2014-09-04 MED ORDER — ONDANSETRON HCL 4 MG/2ML IJ SOLN
INTRAMUSCULAR | Status: AC
  Administered 2014-09-04: 4 mg via INTRAVENOUS
  Filled 2014-09-04: qty 2

## 2014-09-04 MED ORDER — SODIUM CHLORIDE 0.9 % IV SOLN
INTRAVENOUS | Status: DC
  Administered 2014-09-04 – 2014-09-05 (×3): via INTRAVENOUS

## 2014-09-04 MED ORDER — METHYLPREDNISOLONE SODIUM SUCC 40 MG IJ SOLR
40.0000 mg | Freq: Once | INTRAMUSCULAR | Status: AC
  Administered 2014-09-04: 40 mg via INTRAVENOUS

## 2014-09-04 MED ORDER — NICOTINE 7 MG/24HR TD PT24
7.0000 mg | MEDICATED_PATCH | Freq: Every day | TRANSDERMAL | Status: DC
  Filled 2014-09-04 (×2): qty 1

## 2014-09-04 MED ORDER — DOCUSATE SODIUM 100 MG PO CAPS
100.0000 mg | ORAL_CAPSULE | Freq: Two times a day (BID) | ORAL | Status: DC
  Administered 2014-09-04 – 2014-09-08 (×6): 100 mg via ORAL
  Filled 2014-09-04 (×5): qty 1

## 2014-09-04 MED ORDER — HEPARIN SODIUM (PORCINE) 5000 UNIT/ML IJ SOLN
5000.0000 [IU] | Freq: Three times a day (TID) | INTRAMUSCULAR | Status: DC
  Administered 2014-09-04: 5000 [IU] via SUBCUTANEOUS
  Filled 2014-09-04: qty 1

## 2014-09-04 MED ORDER — ACETAMINOPHEN 325 MG PO TABS
650.0000 mg | ORAL_TABLET | Freq: Four times a day (QID) | ORAL | Status: DC | PRN
  Administered 2014-09-04 – 2014-09-05 (×2): 650 mg via ORAL
  Filled 2014-09-04 (×2): qty 2

## 2014-09-04 MED ORDER — FUROSEMIDE 10 MG/ML IJ SOLN: 40.0000 mg | Freq: Once | INTRAMUSCULAR | Status: DC

## 2014-09-04 MED ORDER — NICOTINE 7 MG/24HR TD PT24
7.0000 mg | MEDICATED_PATCH | Freq: Every day | TRANSDERMAL | Status: DC
  Administered 2014-09-04: 7 mg via TRANSDERMAL
  Filled 2014-09-04 (×2): qty 1

## 2014-09-04 MED ORDER — FUROSEMIDE 20 MG PO TABS: 20.0000 mg | ORAL_TABLET | Freq: Every day | ORAL | Status: DC

## 2014-09-04 NOTE — Progress Notes (Signed)
Nurse and resp therapy entered room pt was cyanotic, pt placed on bipap with o2 flow of 60% , pt laboring with each breath, Dr Theador Hawthorne here at bedside order to transfer to ccu , rapid response called, Dr Rosita Kea notified via or nurse Mindy that pt is not stable for surgery

## 2014-09-04 NOTE — Progress Notes (Signed)
Second IV site established in right median vein with #20 gauge cannula  B Senaida Ores RN

## 2014-09-04 NOTE — Progress Notes (Signed)
Pts. Son, Trey Paula, made aware of pt. Being transferred to CCU.

## 2014-09-04 NOTE — H&P (Addendum)
Brenda Higgins is an 79 y.o. female.   Chief Complaint: Hip pain HPI: She presents to the emergency department after a fall. She states that she was getting up to use the bathroom and tripped. She immediately felt pain in her left hip. She is had difficulty walking since a right hip fracture and repair last year. She also admits to feeling slightly weaker than usual due to cough. The patient had been treated for pneumonia a few months ago and is concerned that it may have returned. She admits to bringing up sputum but denies fevers, nausea, or chest pain. The patient usually uses oxygen at home but has a mildly increased oxygen requirement in the emergency department to maintain SpO2 at a minimum of 88%. X-ray of the left hip revealed intertrochanteric fracture which prompted the emergency department to call for admission.  Past Medical History  Diagnosis Date  . COPD (chronic obstructive pulmonary disease)     Past Surgical History  Procedure Laterality Date  . Hip surgery Right     Family History  Problem Relation Age of Onset  . Coronary artery disease Father     And mother   Social History:  reports that she has been smoking Cigarettes.  She does not have any smokeless tobacco history on file. She reports that she does not drink alcohol or use illicit drugs.  Allergies: No Known Allergies  No prescriptions prior to admission   patient cannot remember medications at this time. Her son with whom she lives will bring an updated medication list in the morning.  Results for orders placed or performed during the hospital encounter of 09/04/14 (from the past 48 hour(s))  CBC     Status: Abnormal   Collection Time: 09/04/14  1:32 AM  Result Value Ref Range   WBC 13.4 (H) 3.6 - 11.0 K/uL   RBC 4.41 3.80 - 5.20 MIL/uL   Hemoglobin 12.4 12.0 - 16.0 g/dL   HCT 38.6 35.0 - 47.0 %   MCV 87.6 80.0 - 100.0 fL   MCH 28.1 26.0 - 34.0 pg   MCHC 32.1 32.0 - 36.0 g/dL   RDW 13.8 11.5 - 14.5 %    Platelets 236 150 - 440 K/uL  Comprehensive metabolic panel     Status: Abnormal   Collection Time: 09/04/14  1:32 AM  Result Value Ref Range   Sodium 139 135 - 145 mmol/L   Potassium 3.5 3.5 - 5.1 mmol/L   Chloride 95 (L) 101 - 111 mmol/L   CO2 36 (H) 22 - 32 mmol/L   Glucose, Bld 125 (H) 65 - 99 mg/dL   BUN 21 (H) 6 - 20 mg/dL   Creatinine, Ser 0.42 (L) 0.44 - 1.00 mg/dL   Calcium 9.8 8.9 - 10.3 mg/dL   Total Protein 7.0 6.5 - 8.1 g/dL   Albumin 3.7 3.5 - 5.0 g/dL   AST 28 15 - 41 U/L   ALT 27 14 - 54 U/L   Alkaline Phosphatase 75 38 - 126 U/L   Total Bilirubin 0.3 0.3 - 1.2 mg/dL   GFR calc non Af Amer >60 >60 mL/min   GFR calc Af Amer >60 >60 mL/min    Comment: (NOTE) The eGFR has been calculated using the CKD EPI equation. This calculation has not been validated in all clinical situations. eGFR's persistently <60 mL/min signify possible Chronic Kidney Disease.    Anion gap 8 5 - 15  Troponin I     Status: None  Collection Time: 09/04/14  1:32 AM  Result Value Ref Range   Troponin I <0.03 <0.031 ng/mL    Comment:        NO INDICATION OF MYOCARDIAL INJURY.    Dg Chest 2 View  09/04/2014   CLINICAL DATA:  Patient was sitting in chair, stood up, and fell. Productive cough for 1 week.  EXAM: CHEST  2 VIEW  COMPARISON:  08/22/2013  FINDINGS: Technically limited study due to patient rotation. Cardiac enlargement with diffuse pulmonary vascular congestion. Mild interstitial changes suggesting interstitial edema. Atelectasis in the lung bases. No focal consolidation. No pneumothorax. Calcified and tortuous aorta.  IMPRESSION: Cardiac enlargement with pulmonary vascular congestion interstitial edema.   Electronically Signed   By: Lucienne Capers M.D.   On: 09/04/2014 02:37   Dg Hip Unilat With Pelvis 2-3 Views Left  09/04/2014   CLINICAL DATA:  Patient was sitting in a chair, stood up, and fell 11 p.m. Left hip pain. Cough for 1 week.  EXAM: DG HIP (WITH OR WITHOUT PELVIS) 2-3V  LEFT  COMPARISON:  08/20/2013  FINDINGS: Nondisplaced fracture of the base of the left femoral neck probably focally extending to the inter trochanteric region superiorly. There is impaction of fracture fragments resulting in varus angulation. No dislocation of the left hip. Pelvis appears intact. Previous internal fixation of the right hip.  IMPRESSION: Nondisplaced impacted intertrochanteric fracture of the left hip.   Electronically Signed   By: Lucienne Capers M.D.   On: 09/04/2014 02:36    Review of Systems  Constitutional: Negative for fever and chills.  HENT: Negative for sore throat and tinnitus.   Eyes: Negative for blurred vision and redness.  Respiratory: Positive for cough and sputum production. Negative for shortness of breath.   Cardiovascular: Negative for chest pain, palpitations, orthopnea and PND.  Gastrointestinal: Negative for nausea, vomiting, abdominal pain and diarrhea.  Genitourinary: Negative for dysuria, urgency and frequency.  Musculoskeletal: Positive for joint pain and falls. Negative for myalgias.  Skin: Negative for rash.       No lesions  Neurological: Negative for speech change, focal weakness and weakness.  Endo/Heme/Allergies: Does not bruise/bleed easily.       No temperature intolerance  Psychiatric/Behavioral: Negative for depression and suicidal ideas.    Blood pressure 114/67, pulse 111, temperature 98.4 F (36.9 C), temperature source Oral, resp. rate 18, height 5' (1.524 m), weight 43.273 kg (95 lb 6.4 oz), SpO2 90 %. Physical Exam  Vitals reviewed. Constitutional: She is oriented to person, place, and time. She appears well-developed and well-nourished. No distress.  HENT:  Head: Normocephalic and atraumatic.  Mouth/Throat: Oropharynx is clear and moist.  Eyes: Conjunctivae and EOM are normal. Pupils are equal, round, and reactive to light. No scleral icterus.  Neck: Normal range of motion. Neck supple. No JVD present. No tracheal deviation  present. No thyromegaly present.  Cardiovascular: Normal rate, regular rhythm and normal heart sounds.  Exam reveals no gallop and no friction rub.   No murmur heard. Respiratory: Effort normal and breath sounds normal.  GI: Soft. Bowel sounds are normal. She exhibits no distension. There is no tenderness.  Genitourinary:  Deferred  Musculoskeletal: She exhibits no edema.  Left leg shortened and externally rotated  Lymphadenopathy:    She has no cervical adenopathy.  Neurological: She is alert and oriented to person, place, and time. No cranial nerve deficit. She exhibits normal muscle tone.  Skin: Skin is warm and dry.  Psychiatric: She has a normal mood  and affect. Judgment and thought content normal.     Assessment/Plan 79 year old Caucasian female admitted for hip fracture and COPD exacerbation. 1. Hip fracture: Intertrochanteric fracture of left hip; pain management and place Foley in preparation for surgery. Except for relatively low rehabilitation potential the patient is a low to moderate risk for noncardiac thoracic surgery. (Notably, the patient still smokes 1-2 cigarettes per day however). 2. Acute on chronic respiratory failure with hypoxia: No evidence of pneumonia on chest x-ray however the patient's oxygen prior minute has increased and she has increased sputum production which meets criteria for exacerbation of COPD. I started the patient on Levaquin and Solu-Medrol while she is nothing by mouth. Also, I do not have the patient's full med list at this time but I have started her on albuterol and Spiriva per Gold criteria. 3. Tobacco abuse: NicoDerm patch while the patient is hospitalized 4. DVT prophylaxis: Heparin 5. GI prophylaxis: None The patient is a full code. Time spent on admission orders and patient care approximately 35 minutes  Harrie Foreman 09/04/2014, 6:40 AM

## 2014-09-04 NOTE — Progress Notes (Signed)
[facility] [place] at [facility]   PATIENT NAME: Brenda Higgins    MR#:  [id number]  DATE OF BIRTH:  January 01, 1936  SUBJECTIVE:  CHIEF COMPLAINT:   Chief Complaint  Patient presents with  . Fall   The patient is 79 year old Caucasian female with history of COPD,  end stage,  on oxygen at home at 3 L of oxygen per nasal cannula 24/ 7 who presents to the hospital after fall and hitting the left hip. In emergency room, she was noted to be hypoxic with oxygen levels at around 88% or lower. Her chest x-ray revealed cardiac enlargement, as well as her vascular congestion interstitial edema, patient's hip x-ray revealed nondisplaced impacted intertrochanteric fracture of left hip.  Patient was doing relatively well today in the morning, however, was noted to be cyanotic and with oxygen saturations in 40s were performed which revealed patient pH of 7.11, and PCO2 of more than 120, patient was initiated on BiPAP, unfortunately, her ventilation was poorly effective and the patient is going to be intubated in the intensive care unit where she is going to be transferred today. Patient complained of right lower extremity cramps, just prior to becoming severely hypoxic, morphine was given just prior to episode of hypoxia  Review of Systems  Unable to perform ROS: medical condition    VITAL SIGNS: Blood pressure 108/62, pulse 95, temperature 98.2 F (36.8 C), temperature source Oral, resp. rate 16, height 5' (1.524 m), weight 43.273 kg (95 lb 6.4 oz), SpO2 97 %.  PHYSICAL EXAMINATION:   GENERAL:  79 y.o.-year-old patient lying in the bed in severe respiratory distress , tachypneic, tachycardic, struggling pale, using accessory muscles to breathe, somewhat slumped in the bed.  EYES: Pupils equal, round, reactive to light and accommodation. No scleral icterus. Extraocular muscles intact.  HEENT: Head atraumatic, normocephalic. Oropharynx and nasopharynx clear. Oral mucosa is dry   NECK:  Supple, no jugular venous distention. No thyroid enlargement, no tenderness.  LUNGS: Diminished breath sounds bilaterally, no wheezing, few rhonchi was noted, but no crepitations. Using accessory muscles of respiration, and moderate respiratory distress.  CARDIOVASCULAR: S1, S2 normal. Tachycardic intermittently irregular. 2. At the sixth systolic murmur was heard, no rubs, or gallops.  ABDOMEN: Soft, nontender, nondistended. Bowel sounds present. No organomegaly or mass.  EXTREMITIES: No pedal edema, cyanosis, or clubbing. Left lower extremity is slightly rotated laterally and shortened right lower extremity spent and some cough tenderness was noted on palpation. No swelling NEUROLOGIC: Cranial nerves II through XII are intact. Muscle strength 5/5 in all extremities. Sensation intact. Gait not checked.  PSYCHIATRIC: The patient is alert and oriented x 3.  SKIN: No obvious rash, lesion, or ulcer.   ORDERS/RESULTS REVIEWED:   CBC  Recent Labs Lab 09/04/14 0132  WBC 13.4*  HGB 12.4  HCT 38.6  PLT 236  MCV 87.6  MCH 28.1  MCHC 32.1  RDW 13.8   ------------------------------------------------------------------------------------------------------------------  Chemistries   Recent Labs Lab 09/04/14 0132  NA 139  K 3.5  CL 95*  CO2 36*  GLUCOSE 125*  BUN 21*  CREATININE 0.42*  CALCIUM 9.8  AST 28  ALT 27  ALKPHOS 75  BILITOT 0.3   ------------------------------------------------------------------------------------------------------------------ estimated creatinine clearance is 40.3 mL/min (by C-G formula based on Cr of 0.42). ------------------------------------------------------------------------------------------------------------------  Recent Labs  09/04/14 0132  TSH 1.280    Cardiac Enzymes  Recent Labs Lab 09/04/14 0132  TROPONINI <0.03    ------------------------------------------------------------------------------------------------------------------ Invalid input(s): POCBNP ---------------------------------------------------------------------------------------------------------------  RADIOLOGY: Dg  Chest 2 View  09/04/2014   CLINICAL DATA:  Patient was sitting in chair, stood up, and fell. Productive cough for 1 week.  EXAM: CHEST  2 VIEW  COMPARISON:  08/22/2013  FINDINGS: Technically limited study due to patient rotation. Cardiac enlargement with diffuse pulmonary vascular congestion. Mild interstitial changes suggesting interstitial edema. Atelectasis in the lung bases. No focal consolidation. No pneumothorax. Calcified and tortuous aorta.  IMPRESSION: Cardiac enlargement with pulmonary vascular congestion interstitial edema.   Electronically Signed   By: Burman Nieves M.D.   On: 09/04/2014 02:37   Dg Hip Unilat With Pelvis 2-3 Views Left  09/04/2014   CLINICAL DATA:  Patient was sitting in a chair, stood up, and fell 11 p.m. Left hip pain. Cough for 1 week.  EXAM: DG HIP (WITH OR WITHOUT PELVIS) 2-3V LEFT  COMPARISON:  08/20/2013  FINDINGS: Nondisplaced fracture of the base of the left femoral neck probably focally extending to the inter trochanteric region superiorly. There is impaction of fracture fragments resulting in varus angulation. No dislocation of the left hip. Pelvis appears intact. Previous internal fixation of the right hip.  IMPRESSION: Nondisplaced impacted intertrochanteric fracture of the left hip.   Electronically Signed   By: Burman Nieves M.D.   On: 09/04/2014 02:36    EKG:  Orders placed or performed during the hospital encounter of 09/04/14  . EKG 12-Lead  . EKG 12-Lead  . EKG 12-Lead  . EKG 12-Lead  . EKG 12-Lead  . EKG 12-Lead   repeated EKG today reveals sinus tachycardia with intermittent PACs at a rate of 120 bpm, normal axis, no acute ST-T changes  ASSESSMENT AND PLAN:  Active  Problems:   Hip fracture 1. Acute on chronic left ventricular failure with hypoxia and hypercapnia, transfer patient to intensive care unit for likely intubation and mechanical ventilation. Continue BiPAP for now. Consult pulmonologist and give her 1 dose of Lasix IV and once following urinary output as well as blood pressure and heart rate. Patient's acute hypoxia and hypercapnia episode was likely  related to morphine administration, however, cannot rule out pulmonary embolism or congestive heart failure,  which is present on the most recent chest x-ray on admission 2. Sinus tachycardia likely due to acidosis. Follow with therapy. Check cardiac enzymes 3. Get echocardiogram 3. Leukocytosis, likely stress related. However, cannot rule out underlying infection in the lungs, continue patient on levofloxacin. Get sputum cultures if possible 4. Left hip fracture. Hold off surgery today. Consultation with orthopedist is obtained 5. Right lower extremity cramps, rule out DVT. Get Doppler ultrasound.  6. Ongoing tobacco abuse. Discussed this patient for approximately 3-4 minutes. Nicotine replacement therapy will be initiated with nicotine patch    Management plans discussed with the patient, family and they are in agreement.   DRUG ALLERGIES: No Known Allergies  CODE STATUS:     Code Status Orders        Start     Ordered   09/04/14 0541  Full code   Continuous     09/04/14 0540      TOTAL  critical care TIME TAKING CARE OF THIS PATIENT: 50   minutes .    Katharina Caper M.D on 09/04/2014 at 9:15 AM  Between 7am to 6pm - Pager - [phone]  After 6pm go to [email or web address] - password [email or web address]  [facility]  Office  [phone]  CC: Primary care physician; Pcp Not In System

## 2014-09-04 NOTE — ED Notes (Signed)
Patient transported to X-ray 

## 2014-09-04 NOTE — Progress Notes (Signed)
Patient's son in to visit and I updated him on patient. He asked time of surgery and I told him I will call him once schedule is available

## 2014-09-04 NOTE — ED Provider Notes (Signed)
[facility] Emergency Department Provider Note  ____________________________________________  Time seen: 1:15 AM  I have reviewed the triage vital signs and the nursing notes.   HISTORY  Chief Complaint No chief complaint on file.      HPI DYMIN DINGLEDINE is a 79 y.o. female presents status post accidental trip and fall with 10 out of 10 left hip pain. Patient eyes any head injury during the fall and no loss of consciousness. Patient states she got up from bed and subsequently fell. In addition patient also admits to increased coughing and concern for possible pneumonia. Of note patient has a history of COPD and is on baseline of 2 L of oxygen by nasal cannula. She denies any fever      past medical history  COPD    No past surgical history on file.  No current outpatient prescriptions on file.  Allergies Review of patient's allergies indicates not on file.  No family history on file.  Social History History  Substance Use Topics  . Smoking status: Not on file  . Smokeless tobacco: Not on file  . Alcohol Use: Not on file    Review of Systems  Constitutional: Negative for fever. Eyes: Negative for visual changes. ENT: Negative for sore throat. Cardiovascular: Negative for chest pain. Respiratory: Positive for shortness of breath and cough Gastrointestinal: Negative for abdominal pain, vomiting and diarrhea. Genitourinary: Negative for dysuria. Musculoskeletal: Negative for back pain. Positive left hip pain  Skin: Negative for rash. Neurological: Negative for headaches, focal weakness or numbness.   10-point ROS otherwise negative.  ____________________________________________   PHYSICAL EXAM:  VITAL SIGNS: ED Triage Vitals  Enc Vitals Group     BP --      Pulse --      Resp --      Temp --      Temp src --      SpO2 --      Weight --      Height --      Head Cir --      Peak Flow --      Pain Score --      Pain Loc  --      Pain Edu? --      Excl. in GC? --      Constitutional: Alert and oriented. Well appearing and in no distress. Eyes: Conjunctivae are normal. PERRL. Normal extraocular movements. ENT   Head: Normocephalic and atraumatic.   Nose: No congestion/rhinnorhea.   Mouth/Throat: Mucous membranes are moist.   Neck: No stridor. Cardiovascular: Normal rate, regular rhythm. Normal and symmetric distal pulses are present in all extremities. No murmurs, rubs, or gallops. Respiratory: Normal respiratory effort without tachypnea nor retractions. Breath sounds are clear and equal bilaterally. Diffuse rhonchi and wheezes noted bilaterally Gastrointestinal: Soft and nontender. No distention. There is no CVA tenderness. Genitourinary: deferred Musculoskeletal: Nontender with normal range of motion in all extremities. No joint effusions.  No lower extremity tenderness nor edema. pain with palpation of the left hip  Neurologic:  Normal speech and language. No gross focal neurologic deficits are appreciated. Speech is normal.  Skin:  Skin is warm, dry and intact. No rash noted. Psychiatric: Mood and affect are normal. Speech and behavior are normal. Patient exhibits appropriate insight and judgment.  ____________________________________________    LABS (pertinent positives/negatives)  Labs Reviewed  CBC  COMPREHENSIVE METABOLIC PANEL  TROPONIN I     ____________________________________________   EKG ED ECG REPORT I,  BROWN, [place] N, the attending physician, personally viewed and interpreted this ECG.   Date: 09/04/2014  EKG Time: 1:26 AM  Rate: 105  Rhythm:sinus tachycardia  Axis: Normal  Intervals:none  ST&T Change: None    ____________________________________________    RADIOLOGY  Left hip x-ray revealedhip.  IMPRESSION: Nondisplaced impacted intertrochanteric fracture of the left hip.:  INITIAL IMPRESSION / ASSESSMENT AND PLAN / ED COURSE  Pertinent labs  & imaging results that were available during my care of the patient were reviewed by me and considered in my medical decision making (see chart for details).  Patient with cough and shortness of breath as such 3 DuoNeb's or given with improvement. Patient notified of left hip fracture which was noted on x-ray. Dr. Rosita Kea orthopedic surgeon was notified as well. As well as Dr. Sheryle Hail internal medicine  ____________________________________________   FINAL CLINICAL IMPRESSION(S) / ED DIAGNOSES  Final diagnoses:  Closed left hip fracture, initial encounter  COPD with acute exacerbation      Darci Current, MD 09/04/14 [phone]

## 2014-09-04 NOTE — Progress Notes (Signed)
Paged for rapid response. On arrival, RN and RT present in room. Patient on noninvasive continuous pressure machine, mask in place, no leaks in seal evident. Reported from RN, patient was blue and O2 sats in the 40s. O2 sats were 94,95, HR 112. Charge nurse stated MD was aware of patient status, ordered transfer to ICU. Patient remained on her Bipap during transport, accompanied by RT and arrived in ICU at 0930. Per RN, bedside report requested. On arrival to ICU, nursing staff present at bedside and patient was transferred from bed to ICU bed without episode. During transport, airway intact, skin color WDL, noted increased work of breathing, no extreme respiratory distress noted.

## 2014-09-04 NOTE — Progress Notes (Signed)
PHARMACY - CRITICAL CARE PROGRESS NOTE  Pharmacy Consult for Electrolyte Management  Indication: ICU Status   No Known Allergies  Patient Measurements: Height: 5' (152.4 cm) Weight: 95 lb 6.4 oz (43.273 kg) IBW/kg (Calculated) : 45.5   Vital Signs: Temp: 97.9 F (36.6 C) (07/14 1000) Temp Source: Axillary (07/14 1000) BP: 107/55 mmHg (07/14 1100) Pulse Rate: 91 (07/14 1100) Intake/Output from previous day: 07/13 0701 - 07/14 0700 In: -  Out: 100 [Urine:100] Intake/Output from this shift: Total I/O In: 463.8 [I.V.:388.8; IV Piggyback:75] Out: 500 [Urine:500] Vent settings for last 24 hours:    Labs:  Recent Labs  09/04/14 0132  WBC 13.4*  HGB 12.4  HCT 38.6  PLT 236  CREATININE 0.42*  ALBUMIN 3.7  PROT 7.0  AST 28  ALT 27  ALKPHOS 75  BILITOT 0.3   Estimated Creatinine Clearance: 40.3 mL/min (by C-G formula based on Cr of 0.42).   Recent Labs  09/04/14 0931  GLUCAP 229*    Microbiology: Recent Results (from the past 720 hour(s))  MRSA PCR Screening     Status: None   Collection Time: 09/04/14  6:30 AM  Result Value Ref Range Status   MRSA by PCR NEGATIVE NEGATIVE Final    Comment:        The GeneXpert MRSA Assay (FDA approved for NASAL specimens only), is one component of a comprehensive MRSA colonization surveillance program. It is not intended to diagnose MRSA infection nor to guide or monitor treatment for MRSA infections.     Medications:  Scheduled:  . [START ON 09/07/2014] albuterol  2.5 mg Nebulization Q6H  . budesonide (PULMICORT) nebulizer solution  0.5 mg Nebulization BID  . ceFAZolin  1 g Other To OR  . docusate sodium  100 mg Oral BID  . furosemide  40 mg Intravenous Once  . ipratropium-albuterol  3 mL Nebulization Q6H  . methylPREDNISolone (SOLU-MEDROL) injection  40 mg Intravenous Q12H  . nicotine  7 mg Transdermal Daily  . nicotine  7 mg Transdermal Daily  . [START ON 09/07/2014] tiotropium  18 mcg Inhalation Daily    Infusions:  . sodium chloride 75 mL/hr at 09/04/14 0549    Assessment: 79 yo female ICU patient admitted for hip fracture now with respiratory failure. Patient received Lasix 40mg  IV x 1.      Plan:  Electrolytes are WNL. Will obtain follow-up electrolytes with am labs.    Pharmacy will continue to monitor and adjust per consult.   Simpson,Michael L 09/04/2014,2:13 PM

## 2014-09-04 NOTE — Progress Notes (Signed)
   09/04/14 0900  Clinical Encounter Type  Visited With Patient not available  Visit Type Spiritual support  Referral From Other (Comment) (Page)  Consult/Referral To Chaplain  Spiritual Encounters  Spiritual Needs Other (Comment) (Non identified)  Stress Factors  Patient Stress Factors Health changes  Family Stress Factors None identified  Advance Directives (For Healthcare)  Does patient have an advance directive? No   Chaplain was paged Rapid Response. Patient was not available. Patient was moved to ICU 19.  AD [phone]

## 2014-09-04 NOTE — Progress Notes (Signed)
Patient reports her pain is a 6 and tylenol did not relieve pain. PRN hydrocodone given.

## 2014-09-04 NOTE — Progress Notes (Signed)
Patient was scheduled for surgery today however it was cancelled because patient is not stable. Patient transferred to ICU. Clinical Social Worker (CSW) made ICU CSW aware of above. CSW will continue to follow and assist as needed.   Jetta Lout, LCSWA [phone]

## 2014-09-04 NOTE — Progress Notes (Signed)
Entered patients room for routine therapy,cyanotic, o2 sats 48%,notified nursing,non rebreather mask applied,abg drawn, placed on bipap per MD order,transfered to ccu

## 2014-09-04 NOTE — ED Notes (Signed)
Pt states that she was sitting in chair and stood up and fell. Pt is complaining of pain to left hip. Pt also complains of productive cough for 1 week.

## 2014-09-04 NOTE — Progress Notes (Signed)
Recheck in am, surgery if stable and respiratory status improved.

## 2014-09-04 NOTE — Progress Notes (Signed)
ANTIBIOTIC CONSULT NOTE - INITIAL  Pharmacy Consult for Ceftriaxone  Indication: UTI  Allergies  Allergen Reactions  . Levofloxacin Shortness Of Breath and Rash  . Amoxicillin-Pot Clavulanate Diarrhea    Patient Measurements: Height: 5' (152.4 cm) Weight: 95 lb 6.4 oz (43.273 kg) IBW/kg (Calculated) : 45.5   Vital Signs: Temp: 97.9 F (36.6 C) (07/14 1000) Temp Source: Axillary (07/14 1000) BP: 110/52 mmHg (07/14 1700) Pulse Rate: 93 (07/14 1700) Intake/Output from previous day: 07/13 0701 - 07/14 0700 In: -  Out: 100 [Urine:100] Intake/Output from this shift: Total I/O In: 913.8 [I.V.:838.8; IV Piggyback:75] Out: 500 [Urine:500]  Labs:  Recent Labs  09/04/14 0132  WBC 13.4*  HGB 12.4  PLT 236  CREATININE 0.42*   Estimated Creatinine Clearance: 40.3 mL/min (by C-G formula based on Cr of 0.42). No results for input(s): VANCOTROUGH, VANCOPEAK, VANCORANDOM, GENTTROUGH, GENTPEAK, GENTRANDOM, TOBRATROUGH, TOBRAPEAK, TOBRARND, AMIKACINPEAK, AMIKACINTROU, AMIKACIN in the last 72 hours.   Microbiology: Recent Results (from the past 720 hour(s))  MRSA PCR Screening     Status: None   Collection Time: 09/04/14  6:30 AM  Result Value Ref Range Status   MRSA by PCR NEGATIVE NEGATIVE Final    Comment:        The GeneXpert MRSA Assay (FDA approved for NASAL specimens only), is one component of a comprehensive MRSA colonization surveillance program. It is not intended to diagnose MRSA infection nor to guide or monitor treatment for MRSA infections.     Medical History: Past Medical History  Diagnosis Date  . COPD (chronic obstructive pulmonary disease)     Assessment: 79 yo female with consult for ceftriaxone for UTI  Goal of Therapy:  Resolution of infection  Plan:  Will order ceftriaxone 1 g IV q24h.  Will need to continue to follow culture results.   Marty Heck 09/04/2014,5:42 PM

## 2014-09-04 NOTE — Progress Notes (Signed)
*  PRELIMINARY RESULTS* Echocardiogram 2D Echocardiogram has been performed.  Georgann Housekeeper Hege 09/04/2014, 1:02 PM

## 2014-09-04 NOTE — Progress Notes (Signed)
Patient arrived to unit from the ED. Patient is alert and oriented. Lhip FX, patient complains of pain controlled with PRN pain medication. Patient is on 4L SPo2 [place]. Skin CDI.

## 2014-09-04 NOTE — Progress Notes (Signed)
ANTIBIOTIC CONSULT NOTE - INITIAL  Pharmacy Consult for Levofloxacin  Indication: COPD exacerbation  No Known Allergies  Patient Measurements: Height: 5' (152.4 cm) Weight: 95 lb 6.4 oz (43.273 kg) IBW/kg (Calculated) : 45.5 Adjusted Body Weight:   Vital Signs: Temp: 98.4 F (36.9 C) (07/14 0150) Temp Source: Oral (07/14 0150) BP: 114/67 mmHg (07/14 0526) Pulse Rate: 111 (07/14 0526) Intake/Output from previous day:   Intake/Output from this shift:    Labs:  Recent Labs  09/04/14 0132  WBC 13.4*  HGB 12.4  PLT 236  CREATININE 0.42*   Estimated Creatinine Clearance: 40.3 mL/min (by C-G formula based on Cr of 0.42). No results for input(s): VANCOTROUGH, VANCOPEAK, VANCORANDOM, GENTTROUGH, GENTPEAK, GENTRANDOM, TOBRATROUGH, TOBRAPEAK, TOBRARND, AMIKACINPEAK, AMIKACINTROU, AMIKACIN in the last 72 hours.   Microbiology: No results found for this or any previous visit (from the past 720 hour(s)).  Medical History: Past Medical History  Diagnosis Date  . COPD (chronic obstructive pulmonary disease)     Medications:  Scheduled:  . ceFAZolin  1 g Other To OR  . docusate sodium  100 mg Oral BID  . heparin  5,000 Units Subcutaneous 3 times per day  . levofloxacin (LEVAQUIN) IV  750 mg Intravenous Q48H  . methylPREDNISolone (SOLU-MEDROL) injection  40 mg Intravenous Q12H  . tiotropium  18 mcg Inhalation Daily   Assessment: Patient being treated for possible COPD exacerbation  Goal of Therapy:  Resolution of condition  Plan:  Will start levofloxacin 750 mg iv q48 hours.  Follow up culture results  Camira Geidel D 09/04/2014,6:35 AM

## 2014-09-04 NOTE — Care Management Note (Signed)
Case Management Note  Patient Details  Name: Brenda Higgins MRN: [id number] Date of Birth: 1935-12-14  Subjective/Objective:   Admitted with Intertrochanteric fracture of left hip. Plan was for surgery. Transferred from 1A after being found cyanotic with minimal response to BiPAP. Following progression                Action/Plan:   Expected Discharge Date:                  Expected Discharge Plan:     In-House Referral:     Discharge planning Services     Post Acute Care Choice:    Choice offered to:     DME Arranged:    DME Agency:     HH Arranged:    HH Agency:     Status of Service:  In process, will continue to follow  Medicare Important Message Given:    Date Medicare IM Given:    Medicare IM give by:    Date Additional Medicare IM Given:    Additional Medicare Important Message give by:     If discussed at Long Length of Stay Meetings, dates discussed:    Additional Comments:  Marily Memos, RN 09/04/2014, 9:27 AM

## 2014-09-04 NOTE — Progress Notes (Signed)
Date: 09/04/2014,   MRN# [id number] Brenda Higgins [birth year] Code Status:     Code Status Orders        Start     Ordered   09/04/14 0541  Full code   Continuous     09/04/14 0540     Hosp day:@LENGTHOFSTAYDAYS @ Referring MD: @     PCP:      AdmissionWeight: 110 lb (49.896 kg)                 CurrentWeight: 95 lb 6.4 oz (43.273 kg)  CC: hypercapneic respiratory failure  HPI: This is a 79 yr old female, known to my service, came to [place] with left hip fracture after a fall. Admitted for possible surgery. While on the floor,  received morphine for pain and  Levaquin. While the later  was being infused the patient became tachypnea, seem to develop redness of shin globally and mental status changes (agited). Rapid response was rendered and brought to the ICU. Abg: PH 7.11. pco2 > 120. Bipap was promptly started. Subsequently he vitals and mental status have improved. Voicing no chest pain, pleurisy, hemoptysis, cough or syncope. She is known to have severe copd. FEV1 0.67 liters.     PMHX:   Past Medical History  Diagnosis Date  . COPD (chronic obstructive pulmonary disease)    Surgical Hx:  Past Surgical History  Procedure Laterality Date  . Hip surgery Right    Family Hx:  Family History  Problem Relation Age of Onset  . Coronary artery disease Father     And mother   Social Hx:   History  Substance Use Topics  . Smoking status: Current Every Day Smoker    Types: Cigarettes  . Smokeless tobacco: Not on file  . Alcohol Use: No   Medication:    Home Medication:  No current outpatient prescriptions on file.  Current Medication: @   Allergies:  Levofloxacin and Amoxicillin-pot clavulanate  Review of Systems:per nurse/patient Gen:  Denies  fever, sweats, chills HEENT: Denies blurred vision, double vision, ear pain, eye pain, hearing loss, nose bleeds, sore throat Cvc:  No dizziness, chest pain or heaviness Resp:sob, no pleurisy, left leg pain Gi:  Denies swallowing difficulty, stomach pain, nausea or vomiting, diarrhea, constipation, bowel incontinence Gu:  Denies bladder incontinence, burning urine Ext:   No Joint pain, stiffness or swelling Skin: No skin rash, easy bruising or bleeding or hives Endoc:  No polyuria, polydipsia , polyphagia or weight change Psych: No depression, insomnia or hallucinations  Other:  All other systems negative  Physical Examination:   VS: BP 117/58 mmHg  Pulse 90  Temp(Src) 97.9 F (36.6 C) (Axillary)  Resp 16  Ht 5' (1.524 m)  Wt 95 lb 6.4 oz (43.273 kg)  BMI 18.63 kg/m2  SpO2 98%  General Appearance: initially moderate distress, frial and thin Neuro/psych without focal findings, mental status, speech normal, alert and oriented, cranial nerves 2-12 intact, reflexes normal and symmetric, sensation grossly normal  HEENT: PERRLA, EOM intact, no ptosis, no other lesions noticed Neck: Supple, no stridor, nodes.thyromegaly Pulmonary:.No wheezing, No rales  Sputum Production:   Cardiovascular:  Normal S1,S2.  No m/r/g.  Abdominal aorta pulsation normal.    Abdomen:Benign, Soft, non-tender, No masses, hepatosplenomegaly, No lymphadenopathy Endoc: No evident thyromegaly, no signs of acromegaly or Cushing features Skin:   warm, no rashes, no ecchymosis  Extremities: normal, no cyanosis, clubbing, no edema, warm with normal capillary refill. Other findings:   Labs results:  Recent Labs     09/04/14  0132  HGB  12.4  HCT  38.6  MCV  87.6  WBC  13.4*  BUN  21*  CREATININE  0.42*  GLUCOSE  125*  CALCIUM  9.8  ,   Rad results:   Dg Chest 2 View  09/04/2014   CLINICAL DATA:  Patient was sitting in chair, stood up, and fell. Productive cough for 1 week.  EXAM: CHEST  2 VIEW  COMPARISON:  08/22/2013  FINDINGS: Technically limited study due to patient rotation. Cardiac enlargement with diffuse pulmonary vascular congestion. Mild interstitial changes suggesting interstitial edema. Atelectasis in  the lung bases. No focal consolidation. No pneumothorax. Calcified and tortuous aorta.  IMPRESSION: Cardiac enlargement with pulmonary vascular congestion interstitial edema.   Electronically Signed   By: Burman Nieves M.D.   On: 09/04/2014 02:37   US Venous Img Lower Bilateral  09/04/2014   CLINICAL DATA:  Acute respiratory failure. History of right sided hip fracture. Evaluate for DVT.  EXAM: BILATERAL LOWER EXTREMITY VENOUS DOPPLER ULTRASOUND  TECHNIQUE: Gray-scale sonography with graded compression, as well as color Doppler and duplex ultrasound were performed to evaluate the lower extremity deep venous systems from the level of the common femoral vein and including the common femoral, femoral, profunda femoral, popliteal and calf veins including the posterior tibial, peroneal and gastrocnemius veins when visible. The superficial great saphenous vein was also interrogated. Spectral Doppler was utilized to evaluate flow at rest and with distal augmentation maneuvers in the common femoral, femoral and popliteal veins.  COMPARISON:  None.  FINDINGS: RIGHT LOWER EXTREMITY  Common Femoral Vein: No evidence of thrombus. Normal compressibility, respiratory phasicity and response to augmentation.  Saphenofemoral Junction: No evidence of thrombus. Normal compressibility and flow on color Doppler imaging.  Profunda Femoral Vein: No evidence of thrombus. Normal compressibility and flow on color Doppler imaging.  Femoral Vein: No evidence of thrombus. Normal compressibility, respiratory phasicity and response to augmentation.  Popliteal Vein: No evidence of thrombus. Normal compressibility, respiratory phasicity and response to augmentation.  Calf Veins: No evidence of thrombus. Normal compressibility and flow on color Doppler imaging.  Superficial Great Saphenous Vein: No evidence of thrombus. Normal compressibility and flow on color Doppler imaging.  Venous Reflux:  None.  Other Findings:  None.  LEFT LOWER  EXTREMITY  Common Femoral Vein: No evidence of thrombus. Normal compressibility, respiratory phasicity and response to augmentation.  Saphenofemoral Junction: No evidence of thrombus. Normal compressibility and flow on color Doppler imaging.  Profunda Femoral Vein: No evidence of thrombus. Normal compressibility and flow on color Doppler imaging.  Femoral Vein: No evidence of thrombus. Normal compressibility, respiratory phasicity and response to augmentation.  Popliteal Vein: No evidence of thrombus. Normal compressibility, respiratory phasicity and response to augmentation.  Calf Veins: No evidence of thrombus. Normal compressibility and flow on color Doppler imaging.  Superficial Great Saphenous Vein: No evidence of thrombus. Normal compressibility and flow on color Doppler imaging.  Venous Reflux:  None.  Other Findings:  None.  IMPRESSION: No evidence of DVT within either lower extremity.   Electronically Signed   By: Simonne Come M.D.   On: 09/04/2014 12:38   Dg Hip Unilat With Pelvis 2-3 Views Left  09/04/2014   CLINICAL DATA:  Patient was sitting in a chair, stood up, and fell 11 p.m. Left hip pain. Cough for 1 week.  EXAM: DG HIP (WITH OR WITHOUT PELVIS) 2-3V LEFT  COMPARISON:  08/20/2013  FINDINGS: Nondisplaced fracture of the  base of the left femoral neck probably focally extending to the inter trochanteric region superiorly. There is impaction of fracture fragments resulting in varus angulation. No dislocation of the left hip. Pelvis appears intact. Previous internal fixation of the right hip.  IMPRESSION: Nondisplaced impacted intertrochanteric fracture of the left hip.   Electronically Signed   By: Burman Nieves M.D.   On: 09/04/2014 02:36     Diagnostics: SPIROMETRY: FVC was 1.58 liters, 85% of predicted FEV1 was 0.67, 42% of predicted FEV1 ratio was 43 FEF 25-75% liters per second was 17% of predicted  FLOW VOLUME LOOP: Expiratory flow volume loop is delayed  Impression Severe  obstruction *Compared to Previous Study, numbers are consistent.  Assessment and Plan: Severe copd, chronic co2 retainer, chronic hypoxia on oxygen, presents to ICU with acute hypercapneic respiratory failure. She turned around on bipap.    -consider bipap at night -avoid sedation -keep fer sats 89-91 % range -duo neb -advair 250/50 one puff bid -solumedrol 50 mg iv q 8 hour  Left hip fracture 2/2 fall  -ortho following -when stable suggest doing the procedure under spinal blockade (she has very poor lung function)  ? REACTION TO LEVAQUIN  -benedryl given -on solumedrol -hold levaquin  I have personally obtained a history, examined the patient, evaluated laboratory and imaging results, formulated the assessment and plan and placed orders.  The Patient requires high complexity decision making for assessment and support, frequent evaluation and titration of therapies, application of advanced monitoring technologies and extensive interpretation of multiple databases.   Trimaine Maser,M.D. Pulmonary & Critical care Medicine [facility]

## 2014-09-04 NOTE — Care Management (Signed)
Attempted to meet with patient prior to ORIF to hip; patient was on non rebreather having problems holding up her head and eye rolling back. Nursing Medical sales representative and nursing student in room with patient. Steward Drone RN charge nurse notified along with Dr. Winona Legato. Per Dr. Winona Legato CO2 abnormal; difficulty breathing. Transferred to ICU. RNCM to continue to follow.

## 2014-09-04 NOTE — Consult Note (Addendum)
Left intertrochanteric hip fracture, plan ORIF  Patient transferred to ICU. She was treated for a right intertrochanteric fracture June 2015 and had a difficult time with her pulmonary status. Will await medical stabilization if it even can occur with her pulmonary disease and cardiac disease. We'll follow daily and repair hip when he optimized and as stable as can be.  On exam her leg is minimally shortened and externally rotated. Skin is intact she is able to flex extend her toes. Trace dorsalis pedis pulse.  X-ray show minimally displaced left intertrochanteric hip fracture almost identical to prior right intertrochanteric fracture

## 2014-09-05 ENCOUNTER — Inpatient Hospital Stay: Payer: Medicare Other

## 2014-09-05 ENCOUNTER — Inpatient Hospital Stay: Payer: Medicare Other | Admitting: Anesthesiology

## 2014-09-05 ENCOUNTER — Encounter: Payer: Self-pay | Admitting: Orthopedic Surgery

## 2014-09-05 ENCOUNTER — Encounter
Admission: EM | Disposition: A | Discharge status: Skilled Nursing Facility | Payer: Self-pay | Source: Home / Self Care | Attending: Internal Medicine

## 2014-09-05 HISTORY — PX: INTRAMEDULLARY (IM) NAIL INTERTROCHANTERIC: SHX5875

## 2014-09-05 LAB — BLOOD GAS, ARTERIAL
ACID-BASE EXCESS: 5.4 mmol/L — AB (ref 0.0–3.0)
Bicarbonate: 39.4 mEq/L — ABNORMAL HIGH (ref 21.0–28.0)
FIO2: 100 %
O2 SAT: 93 %
Patient temperature: 37
pCO2 arterial: 124 mmHg (ref 32.0–48.0)
pH, Arterial: 7.11 — CL (ref 7.350–7.450)
pO2, Arterial: 88 mmHg (ref 83.0–108.0)

## 2014-09-05 LAB — BASIC METABOLIC PANEL
Anion gap: 8 (ref 5–15)
BUN: 16 mg/dL (ref 6–20)
CALCIUM: 9 mg/dL (ref 8.9–10.3)
CO2: 32 mmol/L (ref 22–32)
Chloride: 100 mmol/L — ABNORMAL LOW (ref 101–111)
Creatinine, Ser: 0.34 mg/dL — ABNORMAL LOW (ref 0.44–1.00)
GFR calc non Af Amer: >60 mL/min (ref >60)
Glucose, Bld: 119 mg/dL — ABNORMAL HIGH (ref 65–99)
Potassium: 4.1 mmol/L (ref 3.5–5.1)
SODIUM: 140 mmol/L (ref 135–145)

## 2014-09-05 LAB — GLUCOSE, CAPILLARY: Glucose-Capillary: 103 mg/dL — ABNORMAL HIGH (ref 65–99)

## 2014-09-05 LAB — CBC
HCT: 35 % (ref 35.0–47.0)
Hemoglobin: 11.4 g/dL — ABNORMAL LOW (ref 12.0–16.0)
MCH: 28.8 pg (ref 26.0–34.0)
MCHC: 32.5 g/dL (ref 32.0–36.0)
MCV: 88.7 fL (ref 80.0–100.0)
Platelets: 215 10*3/uL (ref 150–440)
RBC: 3.94 MIL/uL (ref 3.80–5.20)
RDW: 13.7 % (ref 11.5–14.5)
WBC: 17.3 10*3/uL — ABNORMAL HIGH (ref 3.6–11.0)

## 2014-09-05 SURGERY — FIXATION, FRACTURE, INTERTROCHANTERIC, WITH INTRAMEDULLARY ROD
Anesthesia: Monitor Anesthesia Care | Site: Hip | Laterality: Left | Wound class: Clean

## 2014-09-05 MED ORDER — ONDANSETRON HCL 4 MG/2ML IJ SOLN: 4.0000 mg | Freq: Once | INTRAMUSCULAR | Status: DC | PRN

## 2014-09-05 MED ORDER — LIDOCAINE HCL 1 % IJ SOLN
INTRAMUSCULAR | Status: DC | PRN
  Administered 2014-09-05: 60 mL

## 2014-09-05 MED ORDER — ENOXAPARIN SODIUM 40 MG/0.4ML ~~LOC~~ SOLN
40.0000 mg | SUBCUTANEOUS | Status: DC
  Administered 2014-09-06 – 2014-09-08 (×3): 40 mg via SUBCUTANEOUS
  Filled 2014-09-05 (×3): qty 0.4

## 2014-09-05 MED ORDER — NICOTINE 10 MG IN INHA
1.0000 | RESPIRATORY_TRACT | Status: DC | PRN
  Filled 2014-09-05: qty 36

## 2014-09-05 MED ORDER — MAGNESIUM HYDROXIDE 400 MG/5ML PO SUSP: 30.0000 mL | Freq: Every day | ORAL | Status: DC | PRN

## 2014-09-05 MED ORDER — ALUM & MAG HYDROXIDE-SIMETH 200-200-20 MG/5ML PO SUSP
30.0000 mL | ORAL | Status: DC | PRN
  Administered 2014-09-06: 30 mL via ORAL
  Filled 2014-09-05: qty 30

## 2014-09-05 MED ORDER — CLINDAMYCIN PHOSPHATE 600 MG/50ML IV SOLN
600.0000 mg | Freq: Four times a day (QID) | INTRAVENOUS | Status: AC
  Administered 2014-09-05 (×2): 600 mg via INTRAVENOUS
  Filled 2014-09-05 (×2): qty 50

## 2014-09-05 MED ORDER — METOCLOPRAMIDE HCL 10 MG PO TABS: 5.0000 mg | ORAL_TABLET | Freq: Three times a day (TID) | ORAL | Status: DC | PRN

## 2014-09-05 MED ORDER — MENTHOL 3 MG MT LOZG
1.0000 | LOZENGE | OROMUCOSAL | Status: DC | PRN
  Filled 2014-09-05: qty 9

## 2014-09-05 MED ORDER — ONDANSETRON HCL 4 MG/2ML IJ SOLN: 4.0000 mg | Freq: Four times a day (QID) | INTRAMUSCULAR | Status: DC | PRN

## 2014-09-05 MED ORDER — NEOMYCIN-POLYMYXIN B GU 40-200000 IR SOLN
Status: DC | PRN
  Administered 2014-09-05: 2 mL

## 2014-09-05 MED ORDER — BUPIVACAINE-EPINEPHRINE 0.5% -1:200000 IJ SOLN
INTRAMUSCULAR | Status: DC | PRN
  Administered 2014-09-05: 30 mL

## 2014-09-05 MED ORDER — PHENOL 1.4 % MT LIQD: 1.0000 | OROMUCOSAL | Status: DC | PRN

## 2014-09-05 MED ORDER — FENTANYL CITRATE (PF) 100 MCG/2ML IJ SOLN: 25.0000 ug | INTRAMUSCULAR | Status: DC | PRN

## 2014-09-05 MED ORDER — BISACODYL 10 MG RE SUPP: 10.0000 mg | Freq: Every day | RECTAL | Status: DC | PRN

## 2014-09-05 MED ORDER — KETAMINE HCL 10 MG/ML IJ SOLN
INTRAMUSCULAR | Status: DC | PRN
  Administered 2014-09-05 (×5): 10 mg via INTRAVENOUS
  Administered 2014-09-05: 20 mg via INTRAVENOUS
  Administered 2014-09-05: 10 mg via INTRAVENOUS

## 2014-09-05 MED ORDER — ONDANSETRON HCL 4 MG PO TABS: 4.0000 mg | ORAL_TABLET | Freq: Four times a day (QID) | ORAL | Status: DC | PRN

## 2014-09-05 MED ORDER — NOREPINEPHRINE 4 MG/250ML-% IV SOLN
INTRAVENOUS | Status: AC
  Filled 2014-09-05: qty 250

## 2014-09-05 MED ORDER — METOCLOPRAMIDE HCL 5 MG/ML IJ SOLN: 5.0000 mg | Freq: Three times a day (TID) | INTRAMUSCULAR | Status: DC | PRN

## 2014-09-05 MED ORDER — ASPIRIN EC 81 MG PO TBEC
81.0000 mg | DELAYED_RELEASE_TABLET | Freq: Every day | ORAL | Status: DC
  Administered 2014-09-05 – 2014-09-08 (×4): 81 mg via ORAL
  Filled 2014-09-05 (×4): qty 1

## 2014-09-05 MED ORDER — MIDAZOLAM HCL 2 MG/2ML IJ SOLN
INTRAMUSCULAR | Status: DC | PRN
  Administered 2014-09-05 (×4): 0.5 mg via INTRAVENOUS

## 2014-09-05 MED ORDER — MAGNESIUM CITRATE PO SOLN
1.0000 | Freq: Once | ORAL | Status: AC | PRN
Start: 2014-09-05 — End: 2014-09-05
  Filled 2014-09-05: qty 296

## 2014-09-05 MED ORDER — SODIUM CHLORIDE 0.9 % IV SOLN
INTRAVENOUS | Status: DC
Start: 2014-09-05 — End: 2014-09-06
  Administered 2014-09-05: 16:00:00 via INTRAVENOUS

## 2014-09-05 SURGICAL SUPPLY — 32 items
BIT DRILL CANN LG 4.3MM (BIT) ×1 IMPLANT
CANISTER SUCT 1200ML W/VALVE (MISCELLANEOUS) ×2 IMPLANT
CHLORAPREP W/TINT 26ML (MISCELLANEOUS) ×2 IMPLANT
DRAPE SHEET LG 3/4 BI-LAMINATE (DRAPES) ×2 IMPLANT
DRAPE SURG 17X11 SM STRL (DRAPES) ×2 IMPLANT
DRAPE U-SHAPE 47X51 STRL (DRAPES) ×2 IMPLANT
DRILL BIT CANN LG 4.3MM (BIT) ×2
ELECT CAUTERY BLADE 6.4 (BLADE) ×2 IMPLANT
GAUZE SPONGE 4X4 12PLY STRL (GAUZE/BANDAGES/DRESSINGS) ×2 IMPLANT
GLOVE BIOGEL PI IND STRL 9 (GLOVE) ×1 IMPLANT
GLOVE BIOGEL PI INDICATOR 9 (GLOVE) ×1
GLOVE SURG ORTHO 9.0 STRL STRW (GLOVE) ×2 IMPLANT
GOWN SPECIALTY ULTRA XL (MISCELLANEOUS) ×2 IMPLANT
GOWN STRL REUS W/ TWL LRG LVL3 (GOWN DISPOSABLE) ×1 IMPLANT
GOWN STRL REUS W/TWL LRG LVL3 (GOWN DISPOSABLE) ×1
GUIDEPIN VERSANAIL DSP 3.2X444 ×2 IMPLANT
IV NS 500ML (IV SOLUTION) ×1
IV NS 500ML BAXH (IV SOLUTION) ×1 IMPLANT
KIT RM TURNOVER STRD PROC AR (KITS) ×2 IMPLANT
MAT BLUE FLOOR 46X72 FLO (MISCELLANEOUS) ×2 IMPLANT
NAIL HIP FRACT 130D 11X180 (Screw) ×2 IMPLANT
NEEDLE FILTER BLUNT 18X 1/2SAF (NEEDLE) ×1
NEEDLE FILTER BLUNT 18X1 1/2 (NEEDLE) ×1 IMPLANT
PACK HIP COMPR (MISCELLANEOUS) ×2 IMPLANT
PAD GROUND ADULT SPLIT (MISCELLANEOUS) ×2 IMPLANT
SCREW BONE CORTICAL 5.0X36 (Screw) ×2 IMPLANT
SCREW LAG HIP NAIL 10.5X95 (Screw) ×2 IMPLANT
STAPLER SKIN PROX 35W (STAPLE) ×2 IMPLANT
SUT VIC AB 1 CT1 36 (SUTURE) ×2 IMPLANT
SUT VIC AB 2-0 CT1 (SUTURE) ×2 IMPLANT
SYRINGE 10CC LL (SYRINGE) ×2 IMPLANT
TAPE MICROFOAM 4IN (TAPE) ×2 IMPLANT

## 2014-09-05 NOTE — Progress Notes (Signed)
PHARMACY - CRITICAL CARE PROGRESS NOTE  Pharmacy Consult for Ceftriaxone Dosing/Electrolyte Management  Indication: UTI/ICU Status   Allergies  Allergen Reactions  . Levofloxacin Shortness Of Breath and Rash  . Amoxicillin-Pot Clavulanate Diarrhea    Patient Measurements: Height: 5' (152.4 cm) Weight: 100 lb 15.5 oz (45.8 kg) IBW/kg (Calculated) : 45.5   Vital Signs: Temp: 98.9 F (37.2 C) (07/15 0300) Temp Source: Oral (07/15 0300) BP: 144/66 mmHg (07/15 0600) Pulse Rate: 96 (07/15 0600) Intake/Output from previous day: 07/14 0701 - 07/15 0700 In: 1938.8 [I.V.:1813.8; IV Piggyback:125] Out: 1025 [Urine:1025] Intake/Output from this shift: Total I/O In: 825 [I.V.:825] Out: 250 [Urine:250] Vent settings for last 24 hours: Vent Mode:  [-]  FiO2 (%):  [30 %] 30 %  Labs:  Recent Labs  09/04/14 0132 09/05/14 0433  WBC 13.4* 17.3*  HGB 12.4 11.4*  HCT 38.6 35.0  PLT 236 215  CREATININE 0.42* 0.34*  ALBUMIN 3.7  --   PROT 7.0  --   AST 28  --   ALT 27  --   ALKPHOS 75  --   BILITOT 0.3  --    Estimated Creatinine Clearance: 42.3 mL/min (by C-G formula based on Cr of 0.34).   Recent Labs  09/04/14 0931  GLUCAP 229*    Microbiology: Recent Results (from the past 720 hour(s))  MRSA PCR Screening     Status: None   Collection Time: 09/04/14  6:30 AM  Result Value Ref Range Status   MRSA by PCR NEGATIVE NEGATIVE Final    Comment:        The GeneXpert MRSA Assay (FDA approved for NASAL specimens only), is one component of a comprehensive MRSA colonization surveillance program. It is not intended to diagnose MRSA infection nor to guide or monitor treatment for MRSA infections.     Medications:  Scheduled:  . [START ON 09/07/2014] albuterol  2.5 mg Nebulization Q6H  . budesonide (PULMICORT) nebulizer solution  0.5 mg Nebulization BID  . ceFAZolin  1 g Other To OR  . cefTRIAXone (ROCEPHIN) IVPB 1 gram/50 mL D5W  1 g Intravenous Q24H  . docusate  sodium  100 mg Oral BID  . furosemide  40 mg Intravenous Once  . furosemide  20 mg Oral Daily  . ipratropium-albuterol  3 mL Nebulization Q6H  . methylPREDNISolone (SOLU-MEDROL) injection  40 mg Intravenous Q12H  . nicotine  7 mg Transdermal Daily  . potassium chloride  20 mEq Oral Daily  . [START ON 09/07/2014] tiotropium  18 mcg Inhalation Daily   Infusions:  . sodium chloride 75 mL/hr at 09/04/14 1955    Assessment: 79 yo female ICU patient admitted for hip fracture now with respiratory failure. Patient developed probable allergy to levofloxacin (rash, SOB) on 7/14.   Plan:   1. Ceftriaxone: Will continue patient on ceftriaxone 1g IV q24hr.   2. Electrolytes: Electrolytes are WNL. Will obtain follow-up electrolytes with am labs.    Pharmacy will continue to monitor and adjust per consult.   Arnell Mausolf L 09/05/2014,6:42 AM

## 2014-09-05 NOTE — Progress Notes (Signed)
Pt returned from OR for Left ORIF of Femur.  Pt awake, but confused. Resp even and slightly labored.  She is on oxygen mask at 7lpm currently. Pt attempted to crawl out of bed in PACU, charge nurse informed that pt may require sitter.

## 2014-09-05 NOTE — Care Management Note (Signed)
Case Management Note  Patient Details  Name: SIGNE JAVORSKY MRN: [id number] Date of Birth: 1935/10/06  Subjective/Objective:   Patient scheduled for surgery today. Following.                 Action/Plan:   Expected Discharge Date:                  Expected Discharge Plan:     In-House Referral:     Discharge planning Services     Post Acute Care Choice:    Choice offered to:     DME Arranged:    DME Agency:     HH Arranged:    HH Agency:     Status of Service:  In process, will continue to follow  Medicare Important Message Given:    Date Medicare IM Given:    Medicare IM give by:    Date Additional Medicare IM Given:    Additional Medicare Important Message give by:     If discussed at Long Length of Stay Meetings, dates discussed:    Additional Comments:  Marily Memos, RN 09/05/2014, 11:32 AM

## 2014-09-05 NOTE — Anesthesia Postprocedure Evaluation (Signed)
  Anesthesia Post-op Note  Patient: Brenda Higgins  Procedure(s) Performed: Procedure(s): INTRAMEDULLARY (IM) NAIL INTERTROCHANTRIC (Left)  Anesthesia type:MAC  Patient location: PACU  Post pain: Pain level controlled  Post assessment: Post-op Vital signs reviewed, Patient's Cardiovascular Status Stable, Respiratory Function Stable, Patent Airway and No signs of Nausea or vomiting  Post vital signs: Reviewed and stable  Last Vitals:  Filed Vitals:   09/05/14 1415  BP:   Pulse: 117  Temp:   Resp: 22    Level of consciousness: awake, alert  and patient cooperative  Complications: No apparent anesthesia complications

## 2014-09-05 NOTE — Progress Notes (Signed)
[facility] [place] at [facility]   PATIENT NAME: Brenda Higgins    MR#:  [id number]  DATE OF BIRTH:  February 14, 1936  SUBJECTIVE:  CHIEF COMPLAINT:   Chief Complaint  Patient presents with  . Fall   The patient is 79 year old Caucasian female with history of COPD,  end stage,  on oxygen at home at 3 L of oxygen per nasal cannula 24/ 7 who presents to the hospital after fall and hitting the left hip. In emergency room, she was noted to be hypoxic with oxygen levels at around 88% or lower. Her chest x-ray revealed cardiac enlargement, as well as her vascular congestion interstitial edema, patient's hip x-ray revealed nondisplaced impacted intertrochanteric fracture of left hip.  Patient was doing relatively well today in the morning, however, was noted to be cyanotic and with oxygen saturations in 40s later in the day. ABGs were performed which revealed pH of 7.11, and PCO2 of more than 120, patient was initiated on BiPAP. She was stabilized on BiPAP and then weaned off BiPAP completely. She did not require any Lasix administration. Today, she is comfortable. Her breathing seemed to be good. She remains somewhat tachycardic, but the vital signs are normal veins and daily use of oxygen through nasal cannula. She was noted to have a rash yesterday that levofloxacin infusion and it was felt that levofloxacin allergy could have been contributing to her clinical decline. Levofloxacin has been stopped. Patient is to undergo operation today  Review of Systems  Unable to perform ROS: medical condition    VITAL SIGNS: Blood pressure 149/75, pulse 103, temperature 98 F (36.7 C), temperature source Oral, resp. rate 24, height 5' (1.524 m), weight 45.8 kg (100 lb 15.5 oz), SpO2 91 %.  PHYSICAL EXAMINATION:   GENERAL:  79 y.o.-year-old patient lying in the bed in no respiratory distress , comfortable in bed  EYES: Pupils equal, round, reactive to light and accommodation. No scleral  icterus. Extraocular muscles intact.  HEENT: Head atraumatic, normocephalic. Oropharynx and nasopharynx clear. Oral mucosa is dry  NECK:  Supple, no jugular venous distention. No thyroid enlargement, no tenderness.  LUNGS: Some diminished breath sounds bilaterally, but no wheezing, few rhonchi was noted, but no crepitations. Not using accessory muscles of respiration, not in respiratory distress.  CARDIOVASCULAR: S1, S2 normal. Tachycardic,  intermittently irregular, no rubs, or gallops.  ABDOMEN: Soft, nontender, nondistended. Bowel sounds present. No organomegaly or mass.  EXTREMITIES: No pedal edema, cyanosis, or clubbing. Left lower extremity is slightly rotated laterally and shortened NEUROLOGIC: Cranial nerves II through XII are intact. Muscle strength 5/5 in all extremities. Sensation intact. Gait not checked.  PSYCHIATRIC: The patient is alert and oriented x 3. Anxious and talkative SKIN: No obvious rash, lesion, or ulcer.   ORDERS/RESULTS REVIEWED:   CBC  Recent Labs Lab 09/04/14 0132 09/05/14 0433  WBC 13.4* 17.3*  HGB 12.4 11.4*  HCT 38.6 35.0  PLT 236 215  MCV 87.6 88.7  MCH 28.1 28.8  MCHC 32.1 32.5  RDW 13.8 13.7   ------------------------------------------------------------------------------------------------------------------  Chemistries   Recent Labs Lab 09/04/14 0132 09/05/14 0433  NA 139 140  K 3.5 4.1  CL 95* 100*  CO2 36* 32  GLUCOSE 125* 119*  BUN 21* 16  CREATININE 0.42* 0.34*  CALCIUM 9.8 9.0  AST 28  --   ALT 27  --   ALKPHOS 75  --   BILITOT 0.3  --    ------------------------------------------------------------------------------------------------------------------ estimated creatinine clearance is 42.3 mL/min (  by C-G formula based on Cr of 0.34). ------------------------------------------------------------------------------------------------------------------  Recent Labs  09/04/14 0132  TSH 1.280    Cardiac Enzymes  Recent  Labs Lab 09/04/14 1019 09/04/14 1619 09/04/14 2221  TROPONINI <0.03 <0.03 <0.03   ------------------------------------------------------------------------------------------------------------------ Invalid input(s): POCBNP ---------------------------------------------------------------------------------------------------------------  RADIOLOGY: Dg Chest 2 View  09/04/2014   CLINICAL DATA:  Patient was sitting in chair, stood up, and fell. Productive cough for 1 week.  EXAM: CHEST  2 VIEW  COMPARISON:  08/22/2013  FINDINGS: Technically limited study due to patient rotation. Cardiac enlargement with diffuse pulmonary vascular congestion. Mild interstitial changes suggesting interstitial edema. Atelectasis in the lung bases. No focal consolidation. No pneumothorax. Calcified and tortuous aorta.  IMPRESSION: Cardiac enlargement with pulmonary vascular congestion interstitial edema.   Electronically Signed   By: Burman Nieves M.D.   On: 09/04/2014 02:37   US Venous Img Lower Bilateral  09/04/2014   CLINICAL DATA:  Acute respiratory failure. History of right sided hip fracture. Evaluate for DVT.  EXAM: BILATERAL LOWER EXTREMITY VENOUS DOPPLER ULTRASOUND  TECHNIQUE: Gray-scale sonography with graded compression, as well as color Doppler and duplex ultrasound were performed to evaluate the lower extremity deep venous systems from the level of the common femoral vein and including the common femoral, femoral, profunda femoral, popliteal and calf veins including the posterior tibial, peroneal and gastrocnemius veins when visible. The superficial great saphenous vein was also interrogated. Spectral Doppler was utilized to evaluate flow at rest and with distal augmentation maneuvers in the common femoral, femoral and popliteal veins.  COMPARISON:  None.  FINDINGS: RIGHT LOWER EXTREMITY  Common Femoral Vein: No evidence of thrombus. Normal compressibility, respiratory phasicity and response to augmentation.   Saphenofemoral Junction: No evidence of thrombus. Normal compressibility and flow on color Doppler imaging.  Profunda Femoral Vein: No evidence of thrombus. Normal compressibility and flow on color Doppler imaging.  Femoral Vein: No evidence of thrombus. Normal compressibility, respiratory phasicity and response to augmentation.  Popliteal Vein: No evidence of thrombus. Normal compressibility, respiratory phasicity and response to augmentation.  Calf Veins: No evidence of thrombus. Normal compressibility and flow on color Doppler imaging.  Superficial Great Saphenous Vein: No evidence of thrombus. Normal compressibility and flow on color Doppler imaging.  Venous Reflux:  None.  Other Findings:  None.  LEFT LOWER EXTREMITY  Common Femoral Vein: No evidence of thrombus. Normal compressibility, respiratory phasicity and response to augmentation.  Saphenofemoral Junction: No evidence of thrombus. Normal compressibility and flow on color Doppler imaging.  Profunda Femoral Vein: No evidence of thrombus. Normal compressibility and flow on color Doppler imaging.  Femoral Vein: No evidence of thrombus. Normal compressibility, respiratory phasicity and response to augmentation.  Popliteal Vein: No evidence of thrombus. Normal compressibility, respiratory phasicity and response to augmentation.  Calf Veins: No evidence of thrombus. Normal compressibility and flow on color Doppler imaging.  Superficial Great Saphenous Vein: No evidence of thrombus. Normal compressibility and flow on color Doppler imaging.  Venous Reflux:  None.  Other Findings:  None.  IMPRESSION: No evidence of DVT within either lower extremity.   Electronically Signed   By: Simonne Come M.D.   On: 09/04/2014 12:38   Dg Chest Port 1 View  09/05/2014   CLINICAL DATA:  COPD.  Hypoxia.  EXAM: PORTABLE CHEST - 1 VIEW  COMPARISON:  09/04/2014  FINDINGS: Changes of COPD again noted. Cardiomegaly and diffuse pulmonary vascular congestion appear similar. Increased  bibasilar infiltrates or atelectasis seen as well as probable tiny right pleural effusion.  No pneumothorax visualized.  IMPRESSION: Increased bibasilar infiltrates or atelectasis and probable tiny right pleural effusion.  Stable cardiomegaly, pulmonary vascular congestion, and COPD.   Electronically Signed   By: Myles Rosenthal M.D.   On: 09/05/2014 07:19   Dg Hip Unilat With Pelvis 2-3 Views Left  09/04/2014   CLINICAL DATA:  Patient was sitting in a chair, stood up, and fell 11 p.m. Left hip pain. Cough for 1 week.  EXAM: DG HIP (WITH OR WITHOUT PELVIS) 2-3V LEFT  COMPARISON:  08/20/2013  FINDINGS: Nondisplaced fracture of the base of the left femoral neck probably focally extending to the inter trochanteric region superiorly. There is impaction of fracture fragments resulting in varus angulation. No dislocation of the left hip. Pelvis appears intact. Previous internal fixation of the right hip.  IMPRESSION: Nondisplaced impacted intertrochanteric fracture of the left hip.   Electronically Signed   By: Burman Nieves M.D.   On: 09/04/2014 02:36    EKG:  Orders placed or performed during the hospital encounter of 09/04/14  . EKG 12-Lead  . EKG 12-Lead  . EKG 12-Lead  . EKG 12-Lead  . EKG 12-Lead  . EKG 12-Lead   repeated EKG today reveals sinus tachycardia with intermittent PACs at a rate of 120 bpm, normal axis, no acute ST-T changes  ASSESSMENT AND PLAN:  Active Problems:   Hip fracture 1. Acute on chronic respiratory failure with hypoxia and hypercapnia, possibly related to allergic reaction to levofloxacin , resolved on BiPAP, now off it on 2 L of oxygen through nasal cannula. She did not require Lasix administration and a chest x-ray improved. Doppler ultrasound is negative for DVT 2. Sinus tachycardia likely due to acidosis. Better with therapy. cardiac enzymes 3 are negative. echocardiogram showed normal ejection fraction of 60-65% and mild mitral regurgitation 3. Leukocytosis, likely  stress and now steroid-dependent related. For now will continue patient on levofloxacin. Get sputum cultures if possible, although patient denies any significant sputum production 4. Left hip fracture. Okay to proceed with surgery today. Consultation with orthopedist was obtained, appreciated 5. Right lower extremity cramps, no DVT on Doppler ultrasound.  6. Ongoing tobacco abuse. Discussed this patient for approximately 3-4 minutes on admission. Nicotine replacement therapy to be continued with nicotine patch, adding Nicotine inhaler , seemed to be comfortable    Management plans discussed with the patient, family and they are in agreement.   DRUG ALLERGIES:  Allergies  Allergen Reactions  . Levofloxacin Shortness Of Breath and Rash  . Amoxicillin-Pot Clavulanate Diarrhea    CODE STATUS:     Code Status Orders        Start     Ordered   09/04/14 0541  Full code   Continuous     09/04/14 0540      TOTAL TIME TAKING CARE OF THIS PATIENT: 35 minutes .    Katharina Caper M.D on 09/05/2014 at 12:26 PM  Between 7am to 6pm - Pager - [phone]  After 6pm go to [email or web address] - password [email or web address]  [facility]  Office  [phone]  CC: Primary care physician; Pcp Not In System

## 2014-09-05 NOTE — Op Note (Signed)
09/04/2014 - 09/05/2014  1:51 PM  PATIENT:  Brenda Higgins  79 y.o. female  PRE-OPERATIVE DIAGNOSIS:  fractured left hip  POST-OPERATIVE DIAGNOSIS:  fractured left hip  PROCEDURE:  Procedure(s): INTRAMEDULLARY (IM) NAIL INTERTROCHANTRIC (Left)  SURGEON: Leitha Schuller, MD  ASSISTANTS: None  ANESTHESIA:   local and MAC  EBL:  Total I/O In: 100 [I.V.:100] Out: 550 [Urine:450; Blood:100]  BLOOD ADMINISTERED:none  DRAINS: none   LOCAL MEDICATIONS USED:  MARCAINE    and XYLOCAINE   SPECIMEN:  No Specimen  DISPOSITION OF SPECIMEN:  N/A  COUNTS:  YES  TOURNIQUET:  * No tourniquets in log *  IMPLANTS: Short affixus this with leg screw and interlocking screw  DICTATION: .Dragon Dictation patient brought the operating room and placed on the operative table fracture table, with the left foot in the traction boot right leg in the well-leg holder. C-arm was brought in and good visualization the fracture was obtained. Appropriate patient education and timeout procedure were completed and local and asthenic infiltrated proximally for rod insertion and interlocking screws distal. The hip was then prepped and draped using a Barrier drape method and a repeat timeout procedure carried out. Small incision was made proximally and a guide were inserted into the tip of the trochanter proximal reaming carried out a 11 x 1 80 affixus rod was then inserted down the canal to the appropriate level. Guide were inserted then into the center center position of the femoral head measured drilled and a 95 mm lag screw inserted proximally locking portion set to allow for compression. The distal screw was inserted through a small incision using the guide drilling measuring placing the screw. Patient's procedure well after closing illness to olecranon staples EBL 100.  PLAN OF CARE: Continue inpatient  PATIENT DISPOSITION:  PACU - hemodynamically stable.

## 2014-09-05 NOTE — Progress Notes (Signed)
Pt woke up and pulled her monitor off and removed her gown.  Pt agitated when this RN walked into room to help. Dois Davenport RN came into room to assist pt with monitor restoration and a doning a clean gown. PT calmer, requesting pain meds.  Morhpine given, see MAR.

## 2014-09-05 NOTE — Anesthesia Preprocedure Evaluation (Signed)
Anesthesia Evaluation  Patient identified by MRN, date of birth, ID band  Reviewed: Allergy & Precautions, NPO status , Patient's Chart, lab work & pertinent test results  History of Anesthesia Complications Negative for: history of anesthetic complications  Airway Mallampati: I  TM Distance: >3 FB Neck ROM: Full    Dental  (+) Upper Dentures, Lower Dentures   Pulmonary COPD (O2 Sats on admission 88%,) COPD inhaler and oxygen dependent, Current Smoker (trying to quick),          Cardiovascular +CHF (hx after previous surgery with spinal)     Neuro/Psych    GI/Hepatic   Endo/Other    Renal/GU      Musculoskeletal   Abdominal   Peds  Hematology  (+) anemia ,   Anesthesia Other Findings   Reproductive/Obstetrics                             Anesthesia Physical Anesthesia Plan  ASA: III and emergent  Anesthesia Plan: MAC   Post-op Pain Management:    Induction: Intravenous  Airway Management Planned: Nasal Cannula  Additional Equipment:   Intra-op Plan:   Post-operative Plan:   Informed Consent: I have reviewed the patients History and Physical, chart, labs and discussed the procedure including the risks, benefits and alternatives for the proposed anesthesia with the patient or authorized representative who has indicated his/her understanding and acceptance.     Plan Discussed with:   Anesthesia Plan Comments:         Anesthesia Quick Evaluation

## 2014-09-05 NOTE — Progress Notes (Signed)
Hope to fix hip today, can be done with MAC and local

## 2014-09-05 NOTE — Progress Notes (Signed)
Date: 09/05/2014,   MRN# [id number] Brenda Higgins [birth year] Code Status:     Code Status Orders        Start     Ordered   09/05/14 1529  Full code   Continuous     09/05/14 1529     Hosp day:@LENGTHOFSTAYDAYS @ Referring MD: @ATDPROV @           AdmissionWeight: 110 lb (49.896 kg)                 CurrentWeight: 100 lb 15.5 oz (45.8 kg)  HPI: In the ICU, fully awake, quite talkative, no distress, speaking in full sentences.  Status post PROCEDURE:  INTRAMEDULLARY (IM) NAIL INTERTROCHANTRIC (Left)  PMHX:   Past Medical History  Diagnosis Date  . COPD (chronic obstructive pulmonary disease)    Surgical Hx:  Past Surgical History  Procedure Laterality Date  . Hip surgery Right   . Intramedullary (im) nail intertrochanteric Left 09/05/2014    Procedure: INTRAMEDULLARY (IM) NAIL INTERTROCHANTRIC;  Surgeon: Kennedy Bucker, MD;  Location: [facility] ORS;  Service: Orthopedics;  Laterality: Left;   Family Hx:  Family History  Problem Relation Age of Onset  . Coronary artery disease Father     And mother   Social Hx:   History  Substance Use Topics  . Smoking status: Current Every Day Smoker    Types: Cigarettes  . Smokeless tobacco: Not on file  . Alcohol Use: No   Medication:    Home Medication:  No current outpatient prescriptions on file.  Current Medication: @CURMEDTAB @   Allergies:  Levofloxacin and Amoxicillin-pot clavulanate  Review of Systems: Gen:  Denies  fever, sweats, chills HEENT: Denies blurred vision, double vision, ear pain, eye pain, hearing loss, nose bleeds, sore throat Cvc:  No dizziness, chest paIN, ectopy Chest: sob, no wheezing, cough or pleurisy Gi: Denies swallowing difficulty, stomach pain, nausea or vomiting, diarrhea, constipation, bowel incontinence Gu:  Denies bladder incontinence, burning urine Ext:   No Joint pain, stiffness or swelling Skin: No skin rash, easy bruising or bleeding or hives Endoc:  No polyuria, polydipsia ,  polyphagia or weight change Psych: No depression, insomnia or hallucinations  Other:  All other systems negative  Physical Examination:   VS: BP 162/83 mmHg  Pulse 106  Temp(Src) 98.4 F (36.9 C) (Tympanic)  Resp 21  Ht 5' (1.524 m)  Wt 100 lb 15.5 oz (45.8 kg)  BMI 19.72 kg/m2  SpO2 90%  General Appearance: No distress, on 6 liters   Neuro: without focal findings, mental status, speech normal, alert and oriented, cranial nerves 2-12 intact, reflexes normal and symmetric, sensation grossly normal  Neck; Supple, no nodes, thyromegaly, jvd HEENT: PERRLA, EOM intact, no ptosis, no other lesions noticed, Mallampati: Pulmonary:.No wheezing, No rales     Cardiovascular:  Normal S1,S2.  No m/r/g.    Abdomen:Benign, Soft, non-tender, No masses, hepatosplenomegaly, No lymphadenopathy Endoc: No evident thyromegaly, no signs of acromegaly or Cushing features Skin:   warm, no rashes, no ecchymosis no bleeding Extremities: normal, no cyanosis, clubbing, no edema, warm with normal capillary refill. Other findings:   Labs results:   Recent Labs     09/04/14  0132  09/05/14  0433  HGB  12.4  11.4*  HCT  38.6  35.0  MCV  87.6  88.7  WBC  13.4*  17.3*  BUN  21*  16  CREATININE  0.42*  0.34*  GLUCOSE  125*  119*  CALCIUM  9.8  9.0  ,  Rad results:   Dg Chest Port 1 View  09/05/2014   CLINICAL DATA:  COPD.  Hypoxia.  EXAM: PORTABLE CHEST - 1 VIEW  COMPARISON:  09/04/2014  FINDINGS: Changes of COPD again noted. Cardiomegaly and diffuse pulmonary vascular congestion appear similar. Increased bibasilar infiltrates or atelectasis seen as well as probable tiny right pleural effusion. No pneumothorax visualized.  IMPRESSION: Increased bibasilar infiltrates or atelectasis and probable tiny right pleural effusion.  Stable cardiomegaly, pulmonary vascular congestion, and COPD.   Electronically Signed   By: Myles Rosenthal M.D.   On: 09/05/2014 07:19   Dg C-arm 61-120 Min  09/05/2014   CLINICAL  DATA:  Left hip fracture.  Status post internal fixation.  EXAM: DG C-ARM 61-120 MIN; LEFT HIP 2 VIEW  COMPARISON:  None.  FINDINGS: AP and cross-table lateral views show into medullary rod and screw fixation of left hip fracture in near anatomic alignment.  IMPRESSION: Internal fixation of left hip fracture in near anatomic alignment.   Electronically Signed   By: Myles Rosenthal M.D.   On: 09/05/2014 13:42   Dg Femur 1v Left  09/05/2014   CLINICAL DATA:  Left hip fracture.  Status post internal fixation.  EXAM: DG C-ARM 61-120 MIN; LEFT HIP 2 VIEW  COMPARISON:  None.  FINDINGS: AP and cross-table lateral views show into medullary rod and screw fixation of left hip fracture in near anatomic alignment.  IMPRESSION: Internal fixation of left hip fracture in near anatomic alignment.   Electronically Signed   By: Myles Rosenthal M.D.   On: 09/05/2014 13:42     Assessment and Plan: Severe copd, chronic co2 retainer, chronic hypoxia on oxygen, presents to ICU with acute hypercapneic respiratory failure. She turned around on bipap. Underwent left intertrochantranc nail placement. For left hip fractureTolerated the procedure well.   -continue bipap at night -avoid sedation -keep fer sats 89-91 % range, wean fio2 as tolerated -duo neb -advair 250/50 one puff bid -wean solumedrol -to rehab Following ortho recs    I have personally obtained a history, examined the patient, evaluated laboratory and imaging results, formulated the assessment and plan and placed orders.  The Patient requires high complexity decision making for assessment and support, frequent evaluation and titration of therapies, application of advanced monitoring technologies and extensive interpretation of multiple databases.   Herbon Fleming,M.D. Pulmonary & Critical care Medicine [facility]

## 2014-09-05 NOTE — Progress Notes (Signed)
O2 sat decreased to 82%. Oxygen increased to 3 liters and O2 sat increased to 92%

## 2014-09-05 NOTE — Transfer of Care (Signed)
Immediate Anesthesia Transfer of Care Note  Patient: Brenda Higgins  Procedure(s) Performed: Procedure(s): INTRAMEDULLARY (IM) NAIL INTERTROCHANTRIC (Left)  Patient Location: PACU  Anesthesia Type:MAC  Level of Consciousness: awake and alert   Airway & Oxygen Therapy: Patient Spontanous Breathing and Patient connected to face mask oxygen  Post-op Assessment: Report given to RN and Post -op Vital signs reviewed and stable  Post vital signs: Reviewed and stable  Last Vitals:  Filed Vitals:   09/05/14 1358  BP: 167/98  Pulse: 120  Temp: 36.9 C  Resp: 23    Complications: No apparent anesthesia complications

## 2014-09-06 ENCOUNTER — Encounter: Payer: Self-pay | Admitting: Internal Medicine

## 2014-09-06 LAB — BASIC METABOLIC PANEL
Anion gap: 8 (ref 5–15)
BUN: 14 mg/dL (ref 6–20)
CHLORIDE: 100 mmol/L — AB (ref 101–111)
CO2: 32 mmol/L (ref 22–32)
Calcium: 8.8 mg/dL — ABNORMAL LOW (ref 8.9–10.3)
Creatinine, Ser: 0.33 mg/dL — ABNORMAL LOW (ref 0.44–1.00)
GFR calc Af Amer: >60 mL/min (ref >60)
GFR calc non Af Amer: >60 mL/min (ref >60)
Glucose, Bld: 106 mg/dL — ABNORMAL HIGH (ref 65–99)
POTASSIUM: 3.8 mmol/L (ref 3.5–5.1)
Sodium: 140 mmol/L (ref 135–145)

## 2014-09-06 LAB — CBC
HCT: 34 % — ABNORMAL LOW (ref 35.0–47.0)
HEMOGLOBIN: 10.9 g/dL — AB (ref 12.0–16.0)
MCH: 28.1 pg (ref 26.0–34.0)
MCHC: 32.1 g/dL (ref 32.0–36.0)
MCV: 87.4 fL (ref 80.0–100.0)
Platelets: 202 10*3/uL (ref 150–440)
RBC: 3.89 MIL/uL (ref 3.80–5.20)
RDW: 13.5 % (ref 11.5–14.5)
WBC: 14.4 10*3/uL — AB (ref 3.6–11.0)

## 2014-09-06 MED ORDER — FUROSEMIDE 10 MG/ML IJ SOLN
40.0000 mg | Freq: Once | INTRAMUSCULAR | Status: AC
  Administered 2014-09-06: 40 mg via INTRAVENOUS
  Filled 2014-09-06: qty 4

## 2014-09-06 MED ORDER — FUROSEMIDE 20 MG PO TABS
20.0000 mg | ORAL_TABLET | Freq: Every day | ORAL | Status: DC
  Administered 2014-09-07 – 2014-09-08 (×2): 20 mg via ORAL
  Filled 2014-09-06 (×2): qty 1

## 2014-09-06 MED ORDER — PREDNISONE 20 MG PO TABS
50.0000 mg | ORAL_TABLET | Freq: Every day | ORAL | Status: DC
  Administered 2014-09-07 – 2014-09-08 (×2): 50 mg via ORAL
  Filled 2014-09-06 (×2): qty 3

## 2014-09-06 NOTE — Progress Notes (Signed)
Physical Therapy Treatment Patient Details Name: Brenda Higgins MRN: [id number] DOB: Apr 03, 1935 Today's Date: 09/06/2014    History of Present Illness fall, L hip fx, ORIF    PT Comments    Pt has a good bit of pain t/o session and though she is able to do some limited acts she is not able to participate as well with the exercises.  Sitting at EOB was very difficult for her and she needed some assist just to stay upright the entire time.  Pt's HR goes to 126 during sitting, not ready for ambulation at this point.   Follow Up Recommendations  SNF (per progress)     Equipment Recommendations       Recommendations for Other Services       Precautions / Restrictions Precautions Precautions: Fall Restrictions LLE Weight Bearing: Weight bearing as tolerated    Mobility  Bed Mobility Overal bed mobility: Needs Assistance Bed Mobility: Supine to Sit;Sit to Supine     Supine to sit: Mod assist;Max assist Sit to supine: Mod assist   General bed mobility comments: Pt needs assist just to stay upright while sitting EOB, she sits ~2 minutes but is complaining about pain most of the time.  Transfers Overall transfer level:  (unable)                  Ambulation/Gait                 Stairs            Wheelchair Mobility    Modified Rankin (Stroke Patients Only)       Balance                                    Cognition Arousal/Alertness: Awake/alert Behavior During Therapy: WFL for tasks assessed/performed Overall Cognitive Status: Within Functional Limits for tasks assessed                      Exercises General Exercises - Lower Extremity Ankle Circles/Pumps: AROM;10 reps;Strengthening Quad Sets: AROM;10 reps;Strengthening Short Arc Quad: AROM;10 reps Heel Slides: AROM;10 reps Hip ABduction/ADduction: AROM;10 reps    General Comments        Pertinent Vitals/Pain Pain Score: 8     Home Living                       Prior Function            PT Goals (current goals can now be found in the care plan section) Progress towards PT goals: Progressing toward goals    Frequency  BID    PT Plan Current plan remains appropriate    Co-evaluation             End of Session   Activity Tolerance: Patient tolerated treatment well Patient left: with nursing/sitter in room;in bed     Time: 1332-1401 PT Time Calculation (min) (ACUTE ONLY): 29 min  Charges:  $Therapeutic Exercise: 8-22 mins $Therapeutic Activity: 8-22 mins                    G Codes:     Loran Senters, PT, DPT [phone]  [name] 09/06/2014, 4:12 PM

## 2014-09-06 NOTE — Progress Notes (Signed)
PT completed evaluation at bedside.

## 2014-09-06 NOTE — Progress Notes (Signed)
Pt not seen at this time. Pt with PT at this time and Pt has increase HR. Will try again at later time/treatment date

## 2014-09-06 NOTE — Evaluation (Signed)
Physical Therapy Evaluation Patient Details Name: Brenda Higgins MRN: [id number] DOB: August 14, 1935 Today's Date: 09/06/2014   History of Present Illness  fall, L hip   Clinical Impression  Limited eval secondary to pt's elevated HR never below 100 and up to 120 with light acts.  She shows good effort and though she has some pain is able to do 10 minutes of exercises apart from the PT exam.    Follow Up Recommendations SNF;Home health PT (per progress, pt wants to go home)    Equipment Recommendations       Recommendations for Other Services       Precautions / Restrictions Precautions Precautions: Fall Restrictions Weight Bearing Restrictions: Yes LLE Weight Bearing: Weight bearing as tolerated Other Position/Activity Restrictions: WBAT L-LE      Mobility  Bed Mobility Overal bed mobility:  (deferred bed mobility, HR increases to 120s with activity)             General bed mobility comments: defer  to PT eval/note  Transfers                    Ambulation/Gait                Stairs            Wheelchair Mobility    Modified Rankin (Stroke Patients Only)       Balance                                             Pertinent Vitals/Pain Pain Assessment: 0-10 Pain Score: 6  Pain Location: L hip    Home Living Family/patient expects to be discharged to:: Private residence Living Arrangements: Children;Other relatives Available Help at Discharge: Family Type of Home: House Home Access: Stairs to enter     Home Layout: One level Home Equipment: Environmental consultant - 2 wheels;Bedside commode      Prior Function Level of Independence: Independent with assistive device(s)               Hand Dominance        Extremity/Trunk Assessment   Upper Extremity Assessment: Defer to OT evaluation           Lower Extremity Assessment: Generalized weakness (has some L LE AROM, pain limited)         Communication   Communication: No difficulties  Cognition Arousal/Alertness: Awake/alert Behavior During Therapy: WFL for tasks assessed/performed Overall Cognitive Status: Within Functional Limits for tasks assessed       Memory: Decreased short-term memory              General Comments General comments (skin integrity, edema, etc.): defer to PT eval    Exercises General Exercises - Lower Extremity Ankle Circles/Pumps: AROM;10 reps;Strengthening Quad Sets: AROM;10 reps;Strengthening Short Arc Quad: AROM;10 reps Heel Slides: AROM;10 reps Hip ABduction/ADduction: AROM;10 reps      Assessment/Plan    PT Assessment Patient needs continued PT services  PT Diagnosis Difficulty walking;Generalized weakness;Acute pain   PT Problem List Decreased strength;Decreased range of motion;Decreased activity tolerance;Decreased balance;Decreased mobility  PT Treatment Interventions Therapeutic exercise;Therapeutic activities;Gait training   PT Goals (Current goals can be found in the Care Plan section) Acute Rehab PT Goals Patient Stated Goal: "I would like to go home" PT Goal Formulation: With patient Time For Goal Achievement: 09/20/14 Potential to Achieve Goals: Good  Frequency BID   Barriers to discharge        Co-evaluation               End of Session                 Time: 1610-9604 PT Time Calculation (min) (ACUTE ONLY): 22 min   Charges:   PT Evaluation $Initial PT Evaluation Tier I: 1 Procedure PT Treatments $Therapeutic Exercise: 8-22 mins   PT G Codes:       Loran Senters, PT, DPT [phone]  [name] 09/06/2014, 1:18 PM

## 2014-09-06 NOTE — Progress Notes (Signed)
Date: 09/06/2014,   MRN# [id number] Brenda Higgins 13-Nov-1935 Code Status:     Code Status Orders        Start     Ordered   09/05/14 1529  Full code   Continuous     09/05/14 1529     Hosp day:@LENGTHOFSTAYDAYS @ Referring MD: @ATDPROV @     PCP:      AdmissionWeight: 110 lb (49.896 kg)                 CurrentWeight: 104 lb 8 oz (47.4 kg)   HPI: In the ICU, fully awake, uneventful night no distress, speaking in full sentences.  Status post PROCEDURE:  INTRAMEDULLARY (IM) NAIL INTERTROCHANTRIC (Left), healing well  PMHX:   Past Medical History  Diagnosis Date  . COPD (chronic obstructive pulmonary disease)   . Hyperlipidemia   . Chronic respiratory failure    Surgical Hx:  Past Surgical History  Procedure Laterality Date  . Hip surgery Right   . Intramedullary (im) nail intertrochanteric Left 09/05/2014    Procedure: INTRAMEDULLARY (IM) NAIL INTERTROCHANTRIC;  Surgeon: Kennedy Bucker, MD;  Location: [facility] ORS;  Service: Orthopedics;  Laterality: Left;   Family Hx:  Family History  Problem Relation Age of Onset  . Coronary artery disease Father     And mother   Social Hx:   History  Substance Use Topics  . Smoking status: Current Every Day Smoker    Types: Cigarettes  . Smokeless tobacco: Not on file  . Alcohol Use: No   Medication:    Home Medication:  No current outpatient prescriptions on file.  Current Medication: @CURMEDTAB @   Allergies:  Levofloxacin and Amoxicillin-pot clavulanate  Review of Systems: Gen:  Denies  fever, sweats, chills HEENT: Denies blurred vision, double vision, ear pain, eye pain, hearing loss, nose bleeds, sore throat Cvc:  No dizziness, chest pain or heaviness Resp:   No ne complaints Gi: Denies swallowing difficulty, stomach pain, nausea or vomiting, diarrhea, constipation, bowel incontinence Gu:  Denies bladder incontinence, burning urine Ext:   No Joint pain, stiffness or swelling Skin: No skin rash, easy bruising or  bleeding or hives Endoc:  No polyuria, polydipsia , polyphagia or weight change Psych: No depression, insomnia or hallucinations  Other:  All other systems negative  Physical Examination:   VS: BP 143/123 mmHg  Pulse 108  Temp(Src) 98.9 F (37.2 C) (Tympanic)  Resp 28  Ht 5' (1.524 m)  Wt 104 lb 8 oz (47.4 kg)  BMI 20.41 kg/m2  SpO2 94%  General Appearance: No distress [place] 02 on Neuro: without focal findings, mental status, speech normal, alert and oriented, cranial nerves 2-12 intact, reflexes normal and symmetric, sensation grossly normal  HEENT: PERRLA, EOM intact, no ptosis, no other lesions noticed, Mallampati: Pulmonary:.No wheezing, No rales  Sputum Production:   Cardiovascular:  Normal S1,S2.  No m/r/g.  Abdominal aorta pulsation normal.    Abdomen:Benign, Soft, non-tender, No masses, hepatosplenomegaly, No lymphadenopathy Endoc: No evident thyromegaly, no signs of acromegaly or Cushing features Skin:   warm, no rashes, no ecchymosis  Extremities: normal, no cyanosis, clubbing, no edema, warm with normal capillary refill. Other findings:   Labs results:   Recent Labs     09/04/14  0132  09/05/14  0433  09/06/14  0635  HGB  12.4  11.4*  10.9*  HCT  38.6  35.0  34.0*  MCV  87.6  88.7  87.4  WBC  13.4*  17.3*  14.4*  BUN  21*  16  14  CREATININE  0.42*  0.34*  0.33*  GLUCOSE  125*  119*  106*  CALCIUM  9.8  9.0  8.8*  ,    Assessment and Plan: Severe copd, chronic co2 retainer, chronic hypoxia on oxygen, presents to ICU with acute hypercapneic respiratory failure. She turned around on bipap. Underwent left intertrochantranc nail placement. For left hip fracture.Tolerated the procedure well.   -continue bipap at night -avoid sedation -keep fer sats 89-91 % range, wean fio2 as tolerated -duo neb -advair 250/50 one puff bid -wean solumedrol -to rehab -following   I have personally obtained a history, examined the patient, evaluated laboratory and imaging  results, formulated the assessment and plan and placed orders.  The Patient requires high complexity decision making for assessment and support, frequent evaluation and titration of therapies, application of advanced monitoring technologies and extensive interpretation of multiple databases.   Dilia Alemany,M.D. Pulmonary & Critical care Medicine [facility]

## 2014-09-06 NOTE — Evaluation (Signed)
Occupational Therapy Evaluation Patient Details Name: Brenda Higgins MRN: [id number] DOB: [birth year] Today's Date: 09/06/2014    History of Present Illness Pt  presents to the emergency department after a fall. She states that she was getting up to use the bathroom and tripped. She immediately felt pain in her left hip. She is had difficulty walking since a right hip fracture and repair last year. Pt sustained left hip Fx   Clinical Impression   Pt demonstrates decrease memory and increase anxiety with activities . Pt alert , oriented to place/ person and able to follow simple directions. Pt demonstrate low overall endurance for all activities and increase HR. Evaluation limited today. Will continue to assess status. Pt demonstrated decrease independence in ADL/self care/ functional mobility, generalized weakness and limited endurance to all activities. Pt would benefit from skilled OT to increase overall functional status/ ADL status    Follow Up Recommendations  SNF;Supervision/Assistance - 24 hour    Equipment Recommendations    BSC, continue to assess A/E needs as treatment progress   Recommendations for Other Services PT consult     Precautions / Restrictions Restrictions Other Position/Activity Restrictions: WBAT L-LE      Mobility Bed Mobility               General bed mobility comments: defer  to PT eval/note  Transfers                      Balance                                            ADL Overall ADL's : Needs assistance/impaired Pt needing assist to position self in bed. Supervision with eating                                        General ADL Comments: moderate assist with UB self care in bed , limited endurance to all activities , Increase HR     Vision     Perception     Praxis      Pertinent Vitals/Pain       Hand Dominance     Extremity/Trunk Assessment Upper Extremity Assessment Upper  Extremity Assessment: Generalized weakness           Communication  WFL   Cognition Arousal/Alertness: Awake/alert   Overall Cognitive Status:  (Pt alert ,  and able to follow simple directions , decrease memory)       Memory: Decreased short-term memory             General Comments   Pt needs verbal cueing with all tasks and demonstrates limited endurance to all activities. Pt pleasant and cooperative     Exercises       Shoulder Instructions      Home Living Family/patient expects to be discharged to:: Private residence Living Arrangements: Children;Other relatives   Type of Home: House Home Access: Stairs to enter (3-4 ?)     Home Layout: One level     Bathroom Shower/Tub: Chief Strategy Officer: Standard     Home Equipment: Environmental consultant - 2 wheels;Bedside commode          Prior Functioning/Environment   Home with family ( son)  OT Diagnosis: Generalized weakness;Cognitive deficits   OT Problem List: Decreased strength;Decreased activity tolerance;Decreased cognition;Decreased knowledge of precautions;Decreased knowledge of use of DME or AE;Decreased safety awareness   OT Treatment/Interventions: Energy conservation;Cognitive remediation/compensation;Therapeutic exercise;Self-care/ADL training;Therapeutic activities;DME and/or AE instruction;Patient/family education (continue to assess status)    OT Goals(Current goals can be found in the care plan section)    OT Frequency:  4x week   Barriers to D/C:            Co-evaluation              End of Session    Activity Tolerance: Treatment limited secondary to medical complications (Comment) Patient left: with nursing/sitter in room   Time: 1100-1115 OT Time Calculation (min): 15 min Charges:  OT Evaluation $Initial OT Evaluation Tier I: 1 Procedure G-Codes:    Taquanna Borras W 09/17/2014, 11:39 AM

## 2014-09-06 NOTE — Progress Notes (Signed)
[facility]   PATIENT NAME: Brenda Higgins    MR#:  [id number]  DATE OF BIRTH:  May 08, 1935  SUBJECTIVE:  CHIEF COMPLAINT:   Chief Complaint  Patient presents with  . Fall   The patient is 79 year old Caucasian female with history of COPD,  end stage,  on oxygen at home at 3 L of oxygen per nasal cannula 24/ 7 who presents to the hospital after fall and hitting the left hip. Aslo had to be placed on Bipap. Rash with levaquin. Toady on HFNC. Feels weak. Pain left hip. POD#1  Review of Systems  Constitutional: Positive for malaise/fatigue. Negative for fever and chills.  HENT: Negative for sore throat.   Eyes: Negative for blurred vision, double vision and pain.  Respiratory: Positive for cough, shortness of breath and wheezing. Negative for hemoptysis.   Cardiovascular: Negative for chest pain, palpitations, orthopnea and leg swelling.  Gastrointestinal: Negative for heartburn, nausea, vomiting, abdominal pain, diarrhea and constipation.  Genitourinary: Negative for dysuria and hematuria.  Musculoskeletal: Negative for back pain and joint pain.  Skin: Negative for rash.  Neurological: Positive for weakness. Negative for sensory change, speech change, focal weakness and headaches.  Endo/Heme/Allergies: Does not bruise/bleed easily.  Psychiatric/Behavioral: Positive for memory loss. Negative for depression. The patient is nervous/anxious.     VITAL SIGNS: Blood pressure 171/106, pulse 119, temperature 98.9 F (37.2 C), temperature source Tympanic, resp. rate 26, height 5' (1.524 m), weight 47.4 kg (104 lb 8 oz), SpO2 96 %.  PHYSICAL EXAMINATION:   GENERAL:  79 y.o.-year-old patient lying in the bed in no respiratory distress , comfortable in bed . Thin and looks critically ill. EYES: Pupils equal, round, reactive to light and accommodation. No scleral icterus. Extraocular muscles intact.  HEENT: Head atraumatic, normocephalic.  Oropharynx and nasopharynx clear. Oral mucosa is dry  NECK:  Supple, no jugular venous distention. No thyroid enlargement, no tenderness.  LUNGS: Increased work of breathing. Bilaterally decreased breath sounds. CARDIOVASCULAR: S1, S2 normal. Tachycardic,  intermittently irregular, no rubs, or gallops.  ABDOMEN: Soft, nontender, nondistended. Bowel sounds present. No organomegaly or mass.  EXTREMITIES: No pedal edema, cyanosis, or clubbing. Dressing over her left hip. NEURO: Cranial nerves II through XII are intact. Muscle strength 5/5 in all extremities. Sensation intact. Gait not checked.  PSYCHIATRIC: The patient is alert and oriented x 3. Anxious. SKIN: No obvious rash, lesion, or ulcer.   ORDERS/RESULTS REVIEWED:   CBC  Recent Labs Lab 09/04/14 0132 09/05/14 0433 09/06/14 0635  WBC 13.4* 17.3* 14.4*  HGB 12.4 11.4* 10.9*  HCT 38.6 35.0 34.0*  PLT 236 215 202  MCV 87.6 88.7 87.4  MCH 28.1 28.8 28.1  MCHC 32.1 32.5 32.1  RDW 13.8 13.7 13.5   ------------------------------------------------------------------------------------------------------------------  Chemistries   Recent Labs Lab 09/04/14 0132 09/05/14 0433 09/06/14 0635  NA 139 140 140  K 3.5 4.1 3.8  CL 95* 100* 100*  CO2 36* 32 32  GLUCOSE 125* 119* 106*  BUN 21* 16 14  CREATININE 0.42* 0.34* 0.33*  CALCIUM 9.8 9.0 8.8*  AST 28  --   --   ALT 27  --   --   ALKPHOS 75  --   --   BILITOT 0.3  --   --    ------------------------------------------------------------------------------------------------------------------ estimated creatinine clearance is 42.3 mL/min (by C-G formula based on Cr of 0.33). ------------------------------------------------------------------------------------------------------------------  Recent Labs  09/04/14 0132  TSH 1.280    Cardiac Enzymes  Recent  Labs Lab 09/04/14 1019 09/04/14 1619 09/04/14 2221  TROPONINI <0.03 <0.03 <0.03    ------------------------------------------------------------------------------------------------------------------ Invalid input(s): POCBNP ---------------------------------------------------------------------------------------------------------------  RADIOLOGY: US Venous Img Lower Bilateral  09/04/2014   CLINICAL DATA:  Acute respiratory failure. History of right sided hip fracture. Evaluate for DVT.  EXAM: BILATERAL LOWER EXTREMITY VENOUS DOPPLER ULTRASOUND  TECHNIQUE: Gray-scale sonography with graded compression, as well as color Doppler and duplex ultrasound were performed to evaluate the lower extremity deep venous systems from the level of the common femoral vein and including the common femoral, femoral, profunda femoral, popliteal and calf veins including the posterior tibial, peroneal and gastrocnemius veins when visible. The superficial great saphenous vein was also interrogated. Spectral Doppler was utilized to evaluate flow at rest and with distal augmentation maneuvers in the common femoral, femoral and popliteal veins.  COMPARISON:  None.  FINDINGS: RIGHT LOWER EXTREMITY  Common Femoral Vein: No evidence of thrombus. Normal compressibility, respiratory phasicity and response to augmentation.  Saphenofemoral Junction: No evidence of thrombus. Normal compressibility and flow on color Doppler imaging.  Profunda Femoral Vein: No evidence of thrombus. Normal compressibility and flow on color Doppler imaging.  Femoral Vein: No evidence of thrombus. Normal compressibility, respiratory phasicity and response to augmentation.  Popliteal Vein: No evidence of thrombus. Normal compressibility, respiratory phasicity and response to augmentation.  Calf Veins: No evidence of thrombus. Normal compressibility and flow on color Doppler imaging.  Superficial Great Saphenous Vein: No evidence of thrombus. Normal compressibility and flow on color Doppler imaging.  Venous Reflux:  None.  Other Findings:  None.   LEFT LOWER EXTREMITY  Common Femoral Vein: No evidence of thrombus. Normal compressibility, respiratory phasicity and response to augmentation.  Saphenofemoral Junction: No evidence of thrombus. Normal compressibility and flow on color Doppler imaging.  Profunda Femoral Vein: No evidence of thrombus. Normal compressibility and flow on color Doppler imaging.  Femoral Vein: No evidence of thrombus. Normal compressibility, respiratory phasicity and response to augmentation.  Popliteal Vein: No evidence of thrombus. Normal compressibility, respiratory phasicity and response to augmentation.  Calf Veins: No evidence of thrombus. Normal compressibility and flow on color Doppler imaging.  Superficial Great Saphenous Vein: No evidence of thrombus. Normal compressibility and flow on color Doppler imaging.  Venous Reflux:  None.  Other Findings:  None.  IMPRESSION: No evidence of DVT within either lower extremity.   Electronically Signed   By: Simonne Come M.D.   On: 09/04/2014 12:38   Dg Chest Port 1 View  09/05/2014   CLINICAL DATA:  COPD.  Hypoxia.  EXAM: PORTABLE CHEST - 1 VIEW  COMPARISON:  09/04/2014  FINDINGS: Changes of COPD again noted. Cardiomegaly and diffuse pulmonary vascular congestion appear similar. Increased bibasilar infiltrates or atelectasis seen as well as probable tiny right pleural effusion. No pneumothorax visualized.  IMPRESSION: Increased bibasilar infiltrates or atelectasis and probable tiny right pleural effusion.  Stable cardiomegaly, pulmonary vascular congestion, and COPD.   Electronically Signed   By: Myles Rosenthal M.D.   On: 09/05/2014 07:19   Dg C-arm 61-120 Min  09/05/2014   CLINICAL DATA:  Left hip fracture.  Status post internal fixation.  EXAM: DG C-ARM 61-120 MIN; LEFT HIP 2 VIEW  COMPARISON:  None.  FINDINGS: AP and cross-table lateral views show into medullary rod and screw fixation of left hip fracture in near anatomic alignment.  IMPRESSION: Internal fixation of left hip fracture  in near anatomic alignment.   Electronically Signed   By: Myles Rosenthal M.D.   On: 09/05/2014  13:42   Dg Femur 1v Left  09/05/2014   CLINICAL DATA:  Left hip fracture.  Status post internal fixation.  EXAM: DG C-ARM 61-120 MIN; LEFT HIP 2 VIEW  COMPARISON:  None.  FINDINGS: AP and cross-table lateral views show into medullary rod and screw fixation of left hip fracture in near anatomic alignment.  IMPRESSION: Internal fixation of left hip fracture in near anatomic alignment.   Electronically Signed   By: Myles Rosenthal M.D.   On: 09/05/2014 13:42    EKG:  Orders placed or performed during the hospital encounter of 09/04/14  . EKG 12-Lead  . EKG 12-Lead  . EKG 12-Lead  . EKG 12-Lead  . EKG 12-Lead  . EKG 12-Lead   repeated EKG today reveals sinus tachycardia with intermittent PACs at a rate of 120 bpm, normal axis, no acute ST-T changes  ASSESSMENT AND PLAN:  Active Problems:   Hip fracture  * Acute on chronic respiratory failure with hypoxia and hypercapnia Due to COPD and  Possible reaction to levaquin. Doppler ultrasound is negative for DVT Also some chf. On  HFNC at 35% and critically ill with high risk for deterioration, cardiac arrest and death Pulmonary on board  * Acute on chronic diastolic chf Will give a dose of IV lasix now. Monitor I/Os  * Leukocytosis Due to stress reaction and steroids  * Left hip fracture POD#1 Monitor for ABLA  * Right lower extremity cramps, no DVT on Doppler ultrasound.   * Ongoing tobacco abuse. Counseled this admission    Management plans discussed with the patient, family and they are in agreement.   DRUG ALLERGIES:  Allergies  Allergen Reactions  . Levofloxacin Shortness Of Breath and Rash  . Amoxicillin-Pot Clavulanate Diarrhea    CODE STATUS:     Code Status Orders        Start     Ordered   09/04/14 0541  Full code   Continuous     09/04/14 0540      TOTAL CRITICAL CARE TIME TAKING CARE OF THIS PATIENT: 35 minutes  .    Milagros Loll R M.D on 09/06/2014 at 10:53 AM  Between 7am to 6pm - Pager - [phone]  After 6pm go to [email or web address] - password [email or web address]  [facility]  Office  [phone]  CC: Primary care physician; Pcp Not In System

## 2014-09-06 NOTE — Progress Notes (Signed)
Subjective: Patient reports some hip pain, much better than pre op   Objective: Vital signs in last 24 hours: Temp:  [98.3 F (36.8 C)-98.9 F (37.2 C)] 98.9 F (37.2 C) (07/16 0200) Pulse Rate:  [84-126] 119 (07/16 0600) Resp:  [15-32] 26 (07/16 0600) BP: (109-171)/(51-124) 171/106 mmHg (07/16 0600) SpO2:  [83 %-98 %] 96 % (07/16 0757) FiO2 (%):  [36 %-40 %] 40 % (07/16 0757) Weight:  [47.4 kg (104 lb 8 oz)] 47.4 kg (104 lb 8 oz) (07/16 0533)  Intake/Output from previous day: 07/15 0701 - 07/16 0700 In: 1437.5 [I.V.:1337.5; IV Piggyback:100] Out: 1775 [Urine:1675; Blood:100] Intake/Output this shift:     Recent Labs  09/04/14 0132 09/05/14 0433 09/06/14 0635  HGB 12.4 11.4* 10.9*    Recent Labs  09/05/14 0433 09/06/14 0635  WBC 17.3* 14.4*  RBC 3.94 3.89  HCT 35.0 34.0*  PLT 215 202    Recent Labs  09/05/14 0433 09/06/14 0635  NA 140 140  K 4.1 3.8  CL 100* 100*  CO2 32 32  BUN 16 14  CREATININE 0.34* 0.33*  GLUCOSE 119* 106*  CALCIUM 9.0 8.8*   No results for input(s): LABPT, INR in the last 72 hours.  Sensation intact distally Dorsiflexion/Plantar flexion intact  Assessment/Plan: PT as tolerated with pulmonary condition.   Brenda Higgins 09/06/2014, 10:31 AM

## 2014-09-06 NOTE — Progress Notes (Signed)
PHARMACY - CRITICAL CARE PROGRESS NOTE  Pharmacy Consult for Ceftriaxone Dosing/Electrolyte Management  Indication: UTI/ICU Status   Allergies  Allergen Reactions  . Levofloxacin Shortness Of Breath and Rash  . Amoxicillin-Pot Clavulanate Diarrhea    Patient Measurements: Height: 5' (152.4 cm) Weight: 104 lb 8 oz (47.4 kg) IBW/kg (Calculated) : 45.5   Vital Signs: Temp: 98.9 F (37.2 C) (07/16 0200) BP: 171/106 mmHg (07/16 0600) Pulse Rate: 119 (07/16 0600) Intake/Output from previous day: 07/15 0701 - 07/16 0700 In: 1437.5 [I.V.:1337.5; IV Piggyback:100] Out: 7672 [CNOBS:9628; Blood:100] Intake/Output from this shift:   Vent settings for last 24 hours: Vent Mode:  [-]  FiO2 (%):  [36 %-40 %] 40 %  Labs:  Recent Labs  09/04/14 0132 09/05/14 0433 09/06/14 0635  WBC 13.4* 17.3* 14.4*  HGB 12.4 11.4* 10.9*  HCT 38.6 35.0 34.0*  PLT 236 215 202  CREATININE 0.42* 0.34* 0.33*  ALBUMIN 3.7  --   --   PROT 7.0  --   --   AST 28  --   --   ALT 27  --   --   ALKPHOS 75  --   --   BILITOT 0.3  --   --    Estimated Creatinine Clearance: 42.3 mL/min (by C-G formula based on Cr of 0.33).   Recent Labs  09/04/14 0931 09/05/14 0734  GLUCAP 229* 103*    Microbiology: Recent Results (from the past 720 hour(s))  MRSA PCR Screening     Status: None   Collection Time: 09/04/14  6:30 AM  Result Value Ref Range Status   MRSA by PCR NEGATIVE NEGATIVE Final    Comment:        The GeneXpert MRSA Assay (FDA approved for NASAL specimens only), is one component of a comprehensive MRSA colonization surveillance program. It is not intended to diagnose MRSA infection nor to guide or monitor treatment for MRSA infections.     Medications:  Scheduled:  . [START ON 09/07/2014] albuterol  2.5 mg Nebulization Q6H  . aspirin EC  81 mg Oral Daily  . budesonide (PULMICORT) nebulizer solution  0.5 mg Nebulization BID  . cefTRIAXone (ROCEPHIN) IVPB 1 gram/50 mL D5W  1 g  Intravenous Q24H  . docusate sodium  100 mg Oral BID  . enoxaparin (LOVENOX) injection  40 mg Subcutaneous Q24H  . ipratropium-albuterol  3 mL Nebulization Q6H  . methylPREDNISolone (SOLU-MEDROL) injection  40 mg Intravenous Q12H  . nicotine  7 mg Transdermal Daily  . [START ON 09/07/2014] tiotropium  18 mcg Inhalation Daily   Infusions:  . sodium chloride 75 mL/hr at 09/04/14 1955  . sodium chloride 75 mL/hr at 09/05/14 1613    Assessment: 79 yo female ICU patient admitted for hip fracture now with respiratory failure. Patient developed probable allergy to levofloxacin (rash, SOB) on 7/14.   Plan:   1. Ceftriaxone: Will continue patient on ceftriaxone 1g IV q24hr.   2. Electrolytes: Electrolytes are WNL. Will obtain follow-up electrolytes with am labs.    Pharmacy will continue to monitor and adjust per consult.   Jonna Dittrich D 09/06/2014,8:15 AM

## 2014-09-07 LAB — MAGNESIUM: MAGNESIUM: 1.9 mg/dL (ref 1.7–2.4)

## 2014-09-07 LAB — BASIC METABOLIC PANEL
ANION GAP: 6 (ref 5–15)
BUN: 18 mg/dL (ref 6–20)
CO2: 32 mmol/L (ref 22–32)
Calcium: 8.2 mg/dL — ABNORMAL LOW (ref 8.9–10.3)
Chloride: 101 mmol/L (ref 101–111)
Creatinine, Ser: 0.39 mg/dL — ABNORMAL LOW (ref 0.44–1.00)
GFR calc Af Amer: >60 mL/min (ref >60)
Glucose, Bld: 98 mg/dL (ref 65–99)
Potassium: 3.3 mmol/L — ABNORMAL LOW (ref 3.5–5.1)
SODIUM: 139 mmol/L (ref 135–145)

## 2014-09-07 LAB — PHOSPHORUS
PHOSPHORUS: 4.1 mg/dL (ref 2.5–4.6)
Phosphorus: 1.8 mg/dL — ABNORMAL LOW (ref 2.5–4.6)

## 2014-09-07 LAB — POTASSIUM: POTASSIUM: 3.8 mmol/L (ref 3.5–5.1)

## 2014-09-07 LAB — HEMOGLOBIN: HEMOGLOBIN: 9.6 g/dL — AB (ref 12.0–16.0)

## 2014-09-07 MED ORDER — POTASSIUM PHOSPHATES 15 MMOLE/5ML IV SOLN
30.0000 mmol | Freq: Once | INTRAVENOUS | Status: AC
  Administered 2014-09-07: 30 mmol via INTRAVENOUS
  Filled 2014-09-07: qty 10

## 2014-09-07 NOTE — Progress Notes (Signed)
PHARMACY -  Pharmacy Consult for Ceftriaxone Dosing/Electrolyte Management  Indication: UTI Allergies  Allergen Reactions  . Levofloxacin Shortness Of Breath and Rash  . Amoxicillin-Pot Clavulanate Diarrhea    Patient Measurements: Height: 5' (152.4 cm) Weight: 101 lb 1 oz (45.842 kg) IBW/kg (Calculated) : 45.5   Vital Signs: Temp: 98.4 F (36.9 C) (07/17 0722) Temp Source: Oral (07/17 0722) BP: 152/71 mmHg (07/17 0722) Pulse Rate: 97 (07/17 0722) Intake/Output from previous day: 07/16 0701 - 07/17 0700 In: -  Out: 3175 [Urine:3175] Intake/Output from this shift: Total I/O In: -  Out: 75 [Urine:75] Vent settings for last 24 hours: Vent Mode:  [-]  FiO2 (%):  [40 %-44 %] 44 %  Labs:  Recent Labs  09/05/14 0433 09/06/14 0635 09/07/14 0506  WBC 17.3* 14.4*  --   HGB 11.4* 10.9* 9.6*  HCT 35.0 34.0*  --   PLT 215 202  --   CREATININE 0.34* 0.33* 0.39*  MG  --   --  1.9  PHOS  --   --  1.8*   Estimated Creatinine Clearance: 42.3 mL/min (by C-G formula based on Cr of 0.39).   Recent Labs  09/04/14 0931 09/05/14 0734  GLUCAP 229* 103*    Microbiology: Recent Results (from the past 720 hour(s))  MRSA PCR Screening     Status: None   Collection Time: 09/04/14  6:30 AM  Result Value Ref Range Status   MRSA by PCR NEGATIVE NEGATIVE Final    Comment:        The GeneXpert MRSA Assay (FDA approved for NASAL specimens only), is one component of a comprehensive MRSA colonization surveillance program. It is not intended to diagnose MRSA infection nor to guide or monitor treatment for MRSA infections.     Medications:  Scheduled:  . aspirin EC  81 mg Oral Daily  . cefTRIAXone (ROCEPHIN) IVPB 1 gram/50 mL D5W  1 g Intravenous Q24H  . docusate sodium  100 mg Oral BID  . enoxaparin (LOVENOX) injection  40 mg Subcutaneous Q24H  . furosemide  20 mg Oral Daily  . nicotine  7 mg Transdermal Daily  . potassium phosphate IVPB (mmol)  30 mmol Intravenous Once   . predniSONE  50 mg Oral Q breakfast  . tiotropium  18 mcg Inhalation Daily   Infusions:     Assessment: 79 yo female ICU patient admitted for hip fracture now with respiratory failure. Patient developed probable allergy to levofloxacin (rash, SOB) on 7/14.   Plan:   1. Ceftriaxone: Will continue patient on ceftriaxone 1g IV q24hr.   2. Electrolytes: Electrolytes wnl except K and Phos. Will give KPhos 30 mmol IV x 1 and will recheck K and Phos @ 1700  Pharmacy will continue to monitor and adjust per consult.   [name] 09/07/2014,8:17 AM

## 2014-09-07 NOTE — Progress Notes (Signed)
[facility] - [place] at [facility]   PATIENT NAME: Brenda Higgins    MR#:  [id number]  DATE OF BIRTH:  12/29/1935  SUBJECTIVE:  CHIEF COMPLAINT:   Chief Complaint  Patient presents with  . Fall   The patient is 79 year old Caucasian female with history of COPD,  end stage,  on oxygen at home at 3 L of oxygen per nasal cannula 24/ 7 who presents to the hospital after fall and hitting the left hip. Aslo had to be placed on Bipap. Rash with levaquin. Up in a chair eating lunch. Pain left hip- better control POD#2  Review of Systems  Constitutional: Positive for malaise/fatigue. Negative for fever and chills.  HENT: Negative for sore throat.   Eyes: Negative for blurred vision, double vision and pain.  Respiratory: Positive for cough, shortness of breath and wheezing. Negative for hemoptysis.   Cardiovascular: Negative for chest pain, palpitations, orthopnea and leg swelling.  Gastrointestinal: Negative for heartburn, nausea, vomiting, abdominal pain, diarrhea and constipation.  Genitourinary: Negative for dysuria and hematuria.  Musculoskeletal: Negative for back pain and joint pain.  Skin: Negative for rash.  Neurological: Positive for weakness. Negative for sensory change, speech change, focal weakness and headaches.  Endo/Heme/Allergies: Does not bruise/bleed easily.  Psychiatric/Behavioral: Positive for memory loss. Negative for depression. The patient is nervous/anxious.     VITAL SIGNS: Blood pressure 156/73, pulse 100, temperature 99.1 F (37.3 C), temperature source Oral, resp. rate 20, height 5' (1.524 m), weight 45.842 kg (101 lb 1 oz), SpO2 94 %.  PHYSICAL EXAMINATION:   GENERAL:  79 y.o.-year-old patient lying in the bed in no respiratory distress , comfortable in bed . Thin and looks critically ill. EYES: Pupils equal, round, reactive to light and accommodation. No scleral icterus. Extraocular muscles intact.  HEENT: Head atraumatic,  normocephalic. Oropharynx and nasopharynx clear. Oral mucosa is dry  NECK:  Supple, no jugular venous distention. No thyroid enlargement, no tenderness.  LUNGS: Increased work of breathing. Bilaterally decreased breath sounds. CARDIOVASCULAR: S1, S2 normal. Tachycardic,  intermittently irregular, no rubs, or gallops.  ABDOMEN: Soft, nontender, nondistended. Bowel sounds present. No organomegaly or mass.  EXTREMITIES: No pedal edema, cyanosis, or clubbing. Dressing over her left hip. NEURO: Cranial nerves II through XII are intact. Muscle strength 5/5 in all extremities. Sensation intact. Gait not checked.  PSYCHIATRIC: The patient is alert and oriented x 3. Anxious. SKIN: No obvious rash, lesion, or ulcer.   ORDERS/RESULTS REVIEWED:   CBC  Recent Labs Lab 09/04/14 0132 09/05/14 0433 09/06/14 0635 09/07/14 0506  WBC 13.4* 17.3* 14.4*  --   HGB 12.4 11.4* 10.9* 9.6*  HCT 38.6 35.0 34.0*  --   PLT 236 215 202  --   MCV 87.6 88.7 87.4  --   MCH 28.1 28.8 28.1  --   MCHC 32.1 32.5 32.1  --   RDW 13.8 13.7 13.5  --    ------------------------------------------------------------------------------------------------------------------  Chemistries   Recent Labs Lab 09/04/14 0132 09/05/14 0433 09/06/14 0635 09/07/14 0506  NA 139 140 140 139  K 3.5 4.1 3.8 3.3*  CL 95* 100* 100* 101  CO2 36* 32 32 32  GLUCOSE 125* 119* 106* 98  BUN 21* 16 14 18   CREATININE 0.42* 0.34* 0.33* 0.39*  CALCIUM 9.8 9.0 8.8* 8.2*  MG  --   --   --  1.9  AST 28  --   --   --   ALT 27  --   --   --  ALKPHOS 75  --   --   --   BILITOT 0.3  --   --   --    ------------------------------------------------------------------------------------------------------------------ estimated creatinine clearance is 42.3 mL/min (by C-G formula based on Cr of 0.39). ------------------------------------------------------------------------------------------------------------------ No results for input(s): TSH,  T4TOTAL, T3FREE, THYROIDAB in the last 72 hours.  Invalid input(s): FREET3  Cardiac Enzymes  Recent Labs Lab 09/04/14 1019 09/04/14 1619 09/04/14 2221  TROPONINI <0.03 <0.03 <0.03   ------------------------------------------------------------------------------------------------------------------ Invalid input(s): POCBNP ---------------------------------------------------------------------------------------------------------------  RADIOLOGY: No results found.  EKG:  Orders placed or performed during the hospital encounter of 09/04/14  . EKG 12-Lead  . EKG 12-Lead  . EKG 12-Lead  . EKG 12-Lead  . EKG 12-Lead  . EKG 12-Lead   repeated EKG today reveals sinus tachycardia with intermittent PACs at a rate of 120 bpm, normal axis, no acute ST-T changes  ASSESSMENT AND PLAN:  Active Problems:   Hip fracture  * Acute on chronic respiratory failure with hypoxia and hypercapnia Due to COPD and  Possible reaction to levaquin. Doppler ultrasound is negative for DVT Also some chf. On 4 L O2 today.  * Acute on chronic diastolic chf Continue PO lasix daily.  * Leukocytosis Due to stress reaction and steroids  * Left hip fracture POD#2 Monitor for ABLA  * Right lower extremity cramps, no DVT on Doppler ultrasound.   * Ongoing tobacco abuse. Counseled this admission    Management plans discussed with the patient, family and they are in agreement.   DRUG ALLERGIES:  Allergies  Allergen Reactions  . Levofloxacin Shortness Of Breath and Rash  . Amoxicillin-Pot Clavulanate Diarrhea    CODE STATUS:     Code Status Orders        Start     Ordered   09/04/14 0541  Full code   Continuous     09/04/14 0540      TOTAL TIME TAKING CARE OF THIS PATIENT: 35 minutes .    Milagros Loll R M.D on 09/07/2014 at 3:32 PM  Between 7am to 6pm - Pager - [phone]  After 6pm go to [email or web address] - password [email or web address]  [facility]  Office   [phone]  CC: Primary care physician; Pcp Not In System

## 2014-09-07 NOTE — Clinical Social Work Placement (Signed)
   CLINICAL SOCIAL WORK PLACEMENT  NOTE  Date:  09/07/2014  Patient Details  Name: Brenda Higgins MRN: [id number] Date of Birth: 1935-09-10  Clinical Social Work is seeking post-discharge placement for this patient at the Skilled  Nursing Facility level of care (*CSW will initial, date and re-position this form in  chart as items are completed):  Yes   Patient/family provided with [facility] Clinical Social Work Department's list of facilities offering this level of care within the geographic area requested by the patient (or if unable, by the patient's family).  Yes   Patient/family informed of their freedom to choose among providers that offer the needed level of care, that participate in Medicare, Medicaid or managed care program needed by the patient, have an available bed and are willing to accept the patient.  Yes   Patient/family informed of [place]'s ownership interest in [facility] and [facility], as well as of the fact that they are under no obligation to receive care at these facilities.  PASRR submitted to EDS on       PASRR number received on       Existing PASRR number confirmed on 09/07/14     FL2 transmitted to all facilities in geographic area requested by pt/family on 09/07/14     FL2 transmitted to all facilities within larger geographic area on       Patient informed that his/her managed care company has contracts with or will negotiate with certain facilities, including the following:            Patient/family informed of bed offers received.  Patient chooses bed at       Physician recommends and patient chooses bed at      Patient to be transferred to   on  .  Patient to be transferred to facility by       Patient family notified on   of transfer.  Name of family member notified:        PHYSICIAN       Additional Comment:    _______________________________________________ Ned Card, LCSW 09/07/2014, 3:01 PM

## 2014-09-07 NOTE — Clinical Social Work Note (Signed)
Clinical Social Work Assessment  Patient Details  Name: Brenda Higgins MRN: [id number] Date of Birth: July 26, 1935  Date of referral:  09/07/14               Reason for consult:  Facility Placement                Permission sought to share information with:  Facility Sport and exercise psychologist, Family Supports Permission granted to share information::  Yes, Verbal Permission Granted  Name::     Brenda Higgins [phone]  Agency::  SNFs  Relationship::     Contact Information:     Housing/Transportation Living arrangements for the past 2 months:  Single Family Home Source of Information:  Patient, Adult Children, Other (Comment Required) (sibling) Patient Interpreter Needed:  None Criminal Activity/Legal Involvement Pertinent to Current Situation/Hospitalization:  No - Comment as needed Significant Relationships:  Adult Children, Other Family Members, Siblings Lives with:  Adult Children Do you feel safe going back to the place where you live?  Yes Need for family participation in patient care:  Yes (Comment)  Care giving concerns:  Pt lives with son who works during the day.    Social Worker assessment / plan:  CSW met with Pt to discuss dc planning. Pt is divorced, lives with her only adult son Brenda Higgins. She is 1 of 10 children. Pt retired from working in [employer]. CSW discussed with Pt need for STR at dc. Pt shared that she went to SNF last July after breaking her other hip. She reports going to [facility] and become tearful when states that she will not go back there again. Pt is open to other SNFs close to her home in [place]. Pt's son Brenda Higgins is her main support. CSW contacted him on the phone and he is agreeable to SNF. He works during the day and is not able to assist Pt at home.  Pt's sister is at bedside offering support as well. CSW will begin SNF bed search.  FL2 on the chart.    Employment status:  Retired Forensic scientist:  Information systems manager, Medicaid In [place] PT Recommendations:   [place] / Referral to community resources:  [place]  Patient/Family's Response to care:  Pt would prefer to go home but realizes that she will need rehab before able. Pt's son is in agreement with SNF and supportive of Pt.   Patient/Family's Understanding of and Emotional Response to Diagnosis, Current Treatment, and Prognosis:  Pt familiar with prognosis and recovery time as she broke her other hip last summer and went to rehab at [facility].   Emotional Assessment Appearance:  Appears stated age Attitude/Demeanor/Rapport:    Affect (typically observed):  Accepting, Anxious Orientation:  Oriented to Self, Oriented to Place, Oriented to  Time, Oriented to Situation Alcohol / Substance use:  Never Used Psych involvement (Current and /or in the community):  No (Comment)  Discharge Needs  Concerns to be addressed:  Adjustment to Illness Readmission within the last 30 days:  No Current discharge risk:  Dependent with Mobility Barriers to Discharge:  Continued Medical Work up   [employer], LCSW 09/07/2014, 2:51 PM

## 2014-09-07 NOTE — Progress Notes (Signed)
PT QUIET. DENIES  ANY AGITATION. RN CONTACTED DR Elpidio Anis . MD ORDERED D/C SITTER AT BEDSIDE ORDER

## 2014-09-07 NOTE — Progress Notes (Signed)
Physical Therapy Treatment Patient Details Name: Brenda Higgins MRN: [id number] DOB: 1935-12-08 Today's Date: 09/07/2014    History of Present Illness fall, L hip fx, ORIF    PT Comments    Pt makes many attempts to avoid PT, but ultimately does participate and though she struggles she does show good effort.  Pt does very poorly with standing/"walking" attempt and needs considerable assistance just to get to the recliner.  Pt anxious and fearful with standing.    Follow Up Recommendations  SNF (per progress)     Equipment Recommendations       Recommendations for Other Services       Precautions / Restrictions Precautions Precautions: Fall Restrictions LLE Weight Bearing: Weight bearing as tolerated    Mobility  Bed Mobility Overal bed mobility: Needs Assistance Bed Mobility: Supine to Sit;Sit to Supine     Supine to sit: Mod assist;Max assist Sit to supine: Mod assist   General bed mobility comments: Pt needs assist just to stay upright while sitting EOB, she sits ~2 minutes but is complaining about pain most of the time.  Transfers Overall transfer level: Needs assistance Equipment used: Rolling walker (2 wheeled) Transfers: Sit to/from Stand Sit to Stand: Mod assist;Max assist         General transfer comment: Pt does poorly with 3 attempts at standing, she needs considerable assist and cuing  Ambulation/Gait Ambulation/Gait assistance: Max assist Ambulation Distance (Feet): 2 Feet Assistive device: Rolling walker (2 wheeled)       General Gait Details: Pt attempts to take some small steps but does poorly and needs considerable assist to stay upright.  Pt anxious and fearful t/o the standing/walking attempt.    Stairs            Wheelchair Mobility    Modified Rankin (Stroke Patients Only)       Balance                                    Cognition Arousal/Alertness: Awake/alert Behavior During Therapy: Anxious Overall  Cognitive Status: Within Functional Limits for tasks assessed                      Exercises General Exercises - Lower Extremity Ankle Circles/Pumps: AROM;10 reps;Strengthening Quad Sets: AROM;10 reps;Strengthening Short Arc Quad: AROM;10 reps Heel Slides: AROM;10 reps Hip ABduction/ADduction: AROM;10 reps    General Comments        Pertinent Vitals/Pain Pain Score: 6     Home Living                      Prior Function            PT Goals (current goals can now be found in the care plan section) Progress towards PT goals: Progressing toward goals    Frequency  BID    PT Plan Current plan remains appropriate    Co-evaluation             End of Session Equipment Utilized During Treatment: Gait belt Activity Tolerance: Patient tolerated treatment well Patient left: with chair alarm set     Time: 6213-0865 PT Time Calculation (min) (ACUTE ONLY): 30 min  Charges:  $Gait Training: 8-22 mins $Therapeutic Exercise: 8-22 mins                    G Codes:  Loran Senters, PT, DPT [phone]  [name] 09/07/2014, 3:10 PM

## 2014-09-07 NOTE — Progress Notes (Signed)
Subjective: Complaining of some left hip pain   Objective: Vital signs in last 24 hours: Temp:  [98.4 F (36.9 C)-99.1 F (37.3 C)] 98.4 F (36.9 C) (07/17 0722) Pulse Rate:  [90-119] 97 (07/17 0722) Resp:  [16-30] 20 (07/17 0722) BP: (90-160)/(57-123) 152/71 mmHg (07/17 0722) SpO2:  [90 %-100 %] 93 % (07/17 0748) FiO2 (%):  [40 %-44 %] 44 % (07/16 1600) Weight:  [45.842 kg (101 lb 1 oz)] 45.842 kg (101 lb 1 oz) (07/17 0500)  Intake/Output from previous day: 07/16 0701 - 07/17 0700 In: -  Out: 3175 [Urine:3175] Intake/Output this shift: Total I/O In: -  Out: 75 [Urine:75]   Recent Labs  09/05/14 0433 09/06/14 0635 09/07/14 0506  HGB 11.4* 10.9* 9.6*    Recent Labs  09/05/14 0433 09/06/14 0635  WBC 17.3* 14.4*  RBC 3.94 3.89  HCT 35.0 34.0*  PLT 215 202    Recent Labs  09/06/14 0635 09/07/14 0506  NA 140 139  K 3.8 3.3*  CL 100* 101  CO2 32 32  BUN 14 18  CREATININE 0.33* 0.39*  GLUCOSE 106* 98  CALCIUM 8.8* 8.2*   No results for input(s): LABPT, INR in the last 72 hours.  Neurologically intact Dressing without any drainage Assessment/Plan: Start physical therapy weightbearing as tolerated on left, somewhat limited secondary to her severe pulmonary condition. Expect her to require rehabilitation stay   Brenda Higgins 09/07/2014, 9:36 AM

## 2014-09-07 NOTE — Progress Notes (Signed)
Report given to [facility], pt to room 158 with sitter.

## 2014-09-08 DIAGNOSIS — J9621 Acute and chronic respiratory failure with hypoxia: Secondary | ICD-10-CM

## 2014-09-08 DIAGNOSIS — J9620 Acute and chronic respiratory failure, unspecified whether with hypoxia or hypercapnia: Secondary | ICD-10-CM

## 2014-09-08 DIAGNOSIS — I5030 Unspecified diastolic (congestive) heart failure: Secondary | ICD-10-CM

## 2014-09-08 DIAGNOSIS — J441 Chronic obstructive pulmonary disease with (acute) exacerbation: Secondary | ICD-10-CM

## 2014-09-08 DIAGNOSIS — I5033 Acute on chronic diastolic (congestive) heart failure: Secondary | ICD-10-CM

## 2014-09-08 LAB — BASIC METABOLIC PANEL
ANION GAP: 5 (ref 5–15)
BUN: 15 mg/dL (ref 6–20)
CALCIUM: 8.5 mg/dL — AB (ref 8.9–10.3)
CHLORIDE: 100 mmol/L — AB (ref 101–111)
CO2: 33 mmol/L — AB (ref 22–32)
Creatinine, Ser: 0.31 mg/dL — ABNORMAL LOW (ref 0.44–1.00)
GFR calc Af Amer: >60 mL/min (ref >60)
GFR calc non Af Amer: >60 mL/min (ref >60)
GLUCOSE: 89 mg/dL (ref 65–99)
Potassium: 3.6 mmol/L (ref 3.5–5.1)
Sodium: 138 mmol/L (ref 135–145)

## 2014-09-08 LAB — MAGNESIUM: Magnesium: 1.7 mg/dL (ref 1.7–2.4)

## 2014-09-08 LAB — PHOSPHORUS: PHOSPHORUS: 2.2 mg/dL — AB (ref 2.5–4.6)

## 2014-09-08 MED ORDER — POLYETHYLENE GLYCOL 3350 17 G PO PACK
17.0000 g | PACK | Freq: Once | ORAL | Status: DC
  Filled 2014-09-08: qty 1

## 2014-09-08 MED ORDER — FLEET ENEMA 7-19 GM/118ML RE ENEM: 1.0000 | ENEMA | Freq: Once | RECTAL | Status: DC | PRN

## 2014-09-08 MED ORDER — HYDROCODONE-ACETAMINOPHEN 5-325 MG PO TABS
1.0000 | ORAL_TABLET | Freq: Four times a day (QID) | ORAL | Status: DC | PRN
Start: 2014-09-08 — End: 2017-05-19

## 2014-09-08 MED ORDER — FUROSEMIDE 20 MG PO TABS: 20.0000 mg | ORAL_TABLET | Freq: Every day | ORAL | Status: AC

## 2014-09-08 MED ORDER — PREDNISONE 20 MG PO TABS: 40.0000 mg | ORAL_TABLET | Freq: Every day | ORAL | Status: AC

## 2014-09-08 MED ORDER — POTASSIUM & SODIUM PHOSPHATES 280-160-250 MG PO PACK
2.0000 | PACK | Freq: Three times a day (TID) | ORAL | Status: DC
  Administered 2014-09-08: 2 via ORAL
  Filled 2014-09-08: qty 2

## 2014-09-08 MED ORDER — TIOTROPIUM BROMIDE MONOHYDRATE 18 MCG IN CAPS
18.0000 ug | ORAL_CAPSULE | Freq: Every day | RESPIRATORY_TRACT | Status: DC
Start: 2014-09-08 — End: 2019-01-10

## 2014-09-08 MED ORDER — DOCUSATE SODIUM 100 MG PO CAPS: 100.0000 mg | ORAL_CAPSULE | Freq: Two times a day (BID) | ORAL | Status: DC

## 2014-09-08 MED ORDER — IPRATROPIUM-ALBUTEROL 0.5-2.5 (3) MG/3ML IN SOLN: 3.0000 mL | Freq: Four times a day (QID) | RESPIRATORY_TRACT | Status: DC

## 2014-09-08 MED ORDER — CEPHALEXIN 500 MG PO CAPS
500.0000 mg | ORAL_CAPSULE | Freq: Three times a day (TID) | ORAL | Status: AC
Start: 2014-09-08 — End: 2014-09-12

## 2014-09-08 MED ORDER — ENOXAPARIN SODIUM 40 MG/0.4ML ~~LOC~~ SOLN: 40.0000 mg | SUBCUTANEOUS | Status: DC

## 2014-09-08 NOTE — Progress Notes (Signed)
Subjective: Complains of hip pain.   Objective: Vital signs in last 24 hours: Temp:  [97.3 F (36.3 C)-99.1 F (37.3 C)] 98.2 F (36.8 C) (07/18 0815) Pulse Rate:  [93-104] 93 (07/18 0815) Resp:  [17-20] 19 (07/18 0815) BP: (113-185)/(55-84) 151/67 mmHg (07/18 0815) SpO2:  [93 %-100 %] 96 % (07/18 0815) Weight:  [44.589 kg (98 lb 4.8 oz)] 44.589 kg (98 lb 4.8 oz) (07/18 0500)  Intake/Output from previous day: 07/17 0701 - 07/18 0700 In: 630 [P.O.:480; IV Piggyback:150] Out: 1300 [Urine:1300] Intake/Output this shift:     Recent Labs  09/06/14 0635 09/07/14 0506  HGB 10.9* 9.6*    Recent Labs  09/06/14 0635  WBC 14.4*  RBC 3.89  HCT 34.0*  PLT 202    Recent Labs  09/07/14 0506 09/07/14 1644 09/08/14 0412  NA 139  --  138  K 3.3* 3.8 3.6  CL 101  --  100*  CO2 32  --  33*  BUN 18  --  15  CREATININE 0.39*  --  0.31*  GLUCOSE 98  --  89  CALCIUM 8.2*  --  8.5*   No results for input(s): LABPT, INR in the last 72 hours.  Neurologically intact Incision: small amount of bloody drainage  Assessment/Plan: Doing better, ok from my standpoint to go to rehab   Brenda Higgins 09/08/2014, 8:26 AM

## 2014-09-08 NOTE — Discharge Instructions (Signed)
Diet: As you were doing prior to hospitalization   Shower:  May shower but keep the wounds dry, use an occlusive plastic wrap, NO SOAKING IN TUB.  If the bandage gets wet, change with a clean dry gauze.  Dressing:  You may change your dressing as needed. Change the dressing with sterile gauze dressing.    Activity:  Increase activity slowly as tolerated, but follow the weight bearing instructions below.  No lifting or driving for 6 weeks.  Weight Bearing:   Weight bearing as tolerated to left lower extremity  To prevent constipation: you may use a stool softener such as -  Colace (over the counter) 100 mg by mouth twice a day  Drink plenty of fluids (prune juice may be helpful) and high fiber foods Miralax (over the counter) for constipation as needed.    Itching:  If you experience itching with your medications, try taking only a single pain pill, or even half a pain pill at a time.  You may take up to 10 pain pills per day, and you can also use benadryl over the counter for itching or also to help with sleep.   Precautions:  If you experience chest pain or shortness of breath - call 911 immediately for transfer to the hospital emergency department!!  If you develop a fever greater that 101 F, purulent drainage from wound, increased redness or drainage from wound, or calf pain-Call [facility]                                              Follow- Up Appointment:  Please call for an appointment to be seen in 2 weeks at [facility]  3L O2 Continuous.  Cardiac diet.  Discharge to SNF

## 2014-09-08 NOTE — Progress Notes (Signed)
Occupational Therapy Treatment Patient Details Name: [name] MRN: [id number] DOB: [birth year] Today's Date: 09/08/2014    History of present illness This patient is a 79 year old female who came to [facility] after a fall suffering a fractured left hip and has an ORIF repair.   OT comments  Patient practiced techniques to Donned/doffed socks and pants with hand over hand assist from bed.   Follow Up Recommendations       Equipment Recommendations       Recommendations for Other Services      Precautions / Restrictions Precautions Precautions: Fall Restrictions Weight Bearing Restrictions: Yes LLE Weight Bearing: Weight bearing as tolerated       Mobility Bed Mobility                  Transfers                      Balance                                   ADL                                                Vision                     Perception     Praxis      Cognition     Overall Cognitive Status: Within Functional Limits for tasks assessed                       Extremity/Trunk Assessment               Exercises     Shoulder Instructions       General Comments      Pertinent Vitals/ Pain          Home Living                                          Prior Functioning/Environment              Frequency       Progress Toward Goals  OT Goals(current goals can now be found in the care plan section)  Progress towards OT goals: Progressing toward goals     Plan Discharge plan remains appropriate    Co-evaluation                 End of Session Equipment Utilized During Treatment:  (Hip kit)   Activity Tolerance Patient tolerated treatment well   Patient Left in bed;with call bell/phone within reach;with bed alarm set   Nurse Communication          Time: 3009-2330 OT Time Calculation (min): 12 min  Charges: OT General  Charges $OT Visit: 1 Procedure OT Treatments $Self Care/Home Management : 8-22 mins  Myrene Galas, MS/OTR/L  09/08/2014, 11:18 AM

## 2014-09-08 NOTE — Discharge Summary (Addendum)
[facility] - [place] at [facility]   PATIENT NAME: Brenda Higgins    MR#:  [id number]  DATE OF BIRTH:  [birth year]  DATE OF ADMISSION:  09/04/2014 ADMITTING PHYSICIAN: Arnaldo Natal, MD  DATE OF DISCHARGE:  09/08/2014  PRIMARY CARE PHYSICIAN: Pcp Not In System    ADMISSION DIAGNOSIS:  Hypertension [I10] Fall [W19.XXXA] COPD with acute exacerbation [J44.1] Closed left hip fracture, initial encounter [S72.002A]  DISCHARGE DIAGNOSIS:  Active Problems:   Hip fracture   COPD exacerbation   Acute on chronic respiratory failure   Acute on chronic diastolic CHF (congestive heart failure), NYHA class 1   Diastolic CHF HTN Acute blood loss anemia  SECONDARY DIAGNOSIS:   Past Medical History  Diagnosis Date  . COPD (chronic obstructive pulmonary disease)   . Hyperlipidemia   . Chronic respiratory failure      ADMITTING HISTORY  Chief Complaint: Hip pain HPI: She presents to the emergency department after a fall. She states that she was getting up to use the bathroom and tripped. She immediately felt pain in her left hip. She is had difficulty walking since a right hip fracture and repair last year. She also admits to feeling slightly weaker than usual due to cough. The patient had been treated for pneumonia a few months ago and is concerned that it may have returned. She admits to bringing up sputum but denies fevers, nausea, or chest pain. The patient usually uses oxygen at home but has a mildly increased oxygen requirement in the emergency department to maintain SpO2 at a minimum of 88%. X-ray of the left hip revealed intertrochanteric fracture which prompted the emergency department to call for admission.   HOSPITAL COURSE:   * Acute on chronic respiratory failure with hypoxia and hypercapnia Due to COPD and Possible reaction to levaquin. Doppler ultrasound is negative for DVT. Also some chf. On 3-4 L O2 today. Baseline. Has end stage COPD with  high risk for exacerbations  * Acute on chronic diastolic chf Continue PO lasix daily.  * Leukocytosis Due to stress reaction and steroids Improved.  * Left hip fracture POD # 3  * Acute blood loss anemia Stable. No transfusion needed  * Right lower extremity cramps, no DVT on Doppler ultrasound.   * Ongoing tobacco abuse. Counseled this admission  Needs follow up with Dr. Glee Arvin in 1 week.   CONSULTS OBTAINED:  Treatment Team:  Mertie Moores, MD  DRUG ALLERGIES:   Allergies  Allergen Reactions  . Levofloxacin Shortness Of Breath and Rash  . Amoxicillin-Pot Clavulanate Diarrhea    DISCHARGE MEDICATIONS:   Current Discharge Medication List    START taking these medications   Details  cephALEXin (KEFLEX) 500 MG capsule Take 1 capsule (500 mg total) by mouth 3 (three) times daily.    docusate sodium (COLACE) 100 MG capsule Take 1 capsule (100 mg total) by mouth 2 (two) times daily. Qty: 10 capsule, Refills: 0    enoxaparin (LOVENOX) 40 MG/0.4ML injection Inject 0.4 mLs (40 mg total) into the skin daily. Qty: 0 Syringe    furosemide (LASIX) 20 MG tablet Take 1 tablet (20 mg total) by mouth daily. Qty: 30 tablet, Refills: 0    HYDROcodone-acetaminophen (NORCO/VICODIN) 5-325 MG per tablet Take 1-2 tablets by mouth every 6 (six) hours as needed for moderate pain. Qty: 30 tablet, Refills: 0    ipratropium-albuterol (DUONEB) 0.5-2.5 (3) MG/3ML SOLN Take 3 mLs by nebulization every 6 (six) hours. Qty: 360 mL, Refills:  0    predniSONE (DELTASONE) 20 MG tablet Take 2 tablets (40 mg total) by mouth daily with breakfast. Qty: 8 tablet, Refills: 0    !! tiotropium (SPIRIVA) 18 MCG inhalation capsule Place 1 capsule (18 mcg total) into inhaler and inhale daily. Qty: 30 capsule, Refills: 0     !! - Potential duplicate medications found. Please discuss with provider.    CONTINUE these medications which have NOT CHANGED   Details  amLODipine (NORVASC) 5  MG tablet Take 5 mg by mouth daily.    aspirin EC 81 MG tablet Take 81 mg by mouth daily.    Fluticasone-Salmeterol (ADVAIR) 100-50 MCG/DOSE AEPB Inhale 1 puff into the lungs 2 (two) times daily.    lovastatin (MEVACOR) 40 MG tablet Take 40 mg by mouth daily.    !! tiotropium (SPIRIVA) 18 MCG inhalation capsule Place 18 mcg into inhaler and inhale daily.     !! - Potential duplicate medications found. Please discuss with provider.    STOP taking these medications     hydrochlorothiazide (MICROZIDE) 12.5 MG capsule      HYDROcodone-acetaminophen (NORCO) 7.5-325 MG per tablet          Today    VITAL SIGNS:  Blood pressure 151/67, pulse 89, temperature 98.2 F (36.8 C), temperature source Oral, resp. rate 19, height 5' (1.524 m), weight 44.589 kg (98 lb 4.8 oz), SpO2 94 %.  I/O:   Intake/Output Summary (Last 24 hours) at 09/08/14 1212 Last data filed at 09/08/14 1125  Gross per 24 hour  Intake    390 ml  Output   1400 ml  Net  -1010 ml    PHYSICAL EXAMINATION:  Physical Exam  GENERAL:  79 y.o.-year-old patient lying in the bed with no acute distress. Thin. LUNGS: Bilateral mild weezing. CARDIOVASCULAR: S1, S2 normal. No murmurs, rubs, or gallops.  ABDOMEN: Soft, non-tender, non-distended. Bowel sounds present. No organomegaly or mass.  NEUROLOGIC: Moves all 4 extremities. PSYCHIATRIC: The patient is alert and oriented x 3.  SKIN: No obvious rash, lesion, or ulcer.  Dressing over left hip.  DATA REVIEW:   CBC  Recent Labs Lab 09/06/14 0635 09/07/14 0506  WBC 14.4*  --   HGB 10.9* 9.6*  HCT 34.0*  --   PLT 202  --     Chemistries   Recent Labs Lab 09/04/14 0132  09/08/14 0412  NA 139  < > 138  K 3.5  < > 3.6  CL 95*  < > 100*  CO2 36*  < > 33*  GLUCOSE 125*  < > 89  BUN 21*  < > 15  CREATININE 0.42*  < > 0.31*  CALCIUM 9.8  < > 8.5*  MG  --   < > 1.7  AST 28  --   --   ALT 27  --   --   ALKPHOS 75  --   --   BILITOT 0.3  --   --   < > =  values in this interval not displayed.  Cardiac Enzymes  Recent Labs Lab 09/04/14 2221  TROPONINI <0.03    Microbiology Results  Results for orders placed or performed during the hospital encounter of 09/04/14  MRSA PCR Screening     Status: None   Collection Time: 09/04/14  6:30 AM  Result Value Ref Range Status   MRSA by PCR NEGATIVE NEGATIVE Final    Comment:        The GeneXpert MRSA Assay (FDA approved  for NASAL specimens only), is one component of a comprehensive MRSA colonization surveillance program. It is not intended to diagnose MRSA infection nor to guide or monitor treatment for MRSA infections.     RADIOLOGY:  No results found.    Follow up with Orthopedics and Pulmonary in 1-2 week.  Management plans discussed with the patient, family and they are in agreement.  CODE STATUS:     Code Status Orders        Start     Ordered   09/05/14 1529  Full code   Continuous     09/05/14 1529      TOTAL TIME TAKING CARE OF THIS PATIENT ON DAY OF DISCHARGE: more than 30 minutes.    Milagros Loll R M.D on 09/08/2014 at 12:12 PM  Between 7am to 6pm - Pager - [phone]  After 6pm go to [email or web address] - password [email or web address]  [facility]  Office  [phone]  CC: Primary care physician; Pcp Not In System

## 2014-09-08 NOTE — Clinical Social Work Note (Signed)
Pt is ready for d/c today.  CSW extended bed offers and pt chose [facility].  CSW informed facility and sent d/c documentation.  Pt will be transported via EMS.  Room and report information given to RN.  CSW signing off as there are no further needs at this time.  [place], [place] [phone]

## 2014-09-08 NOTE — Progress Notes (Signed)
PHARMACY -  Pharmacy Consult for Ceftriaxone Dosing/Electrolyte Management  Indication: UTI Allergies  Allergen Reactions  . Levofloxacin Shortness Of Breath and Rash  . Amoxicillin-Pot Clavulanate Diarrhea    Patient Measurements: Height: 5' (152.4 cm) Weight: 98 lb 4.8 oz (44.589 kg) IBW/kg (Calculated) : 45.5   Vital Signs: Temp: 98.2 F (36.8 C) (07/18 0815) Temp Source: Oral (07/18 0815) BP: 151/67 mmHg (07/18 0815) Pulse Rate: 93 (07/18 0815) Intake/Output from previous day: 07/17 0701 - 07/18 0700 In: 630 [P.O.:480; IV Piggyback:150] Out: 1300 [Urine:1300] Intake/Output from this shift:   Vent settings for last 24 hours:    Labs:  Recent Labs  09/06/14 0635 09/07/14 0506 09/07/14 1644 09/08/14 0412  WBC 14.4*  --   --   --   HGB 10.9* 9.6*  --   --   HCT 34.0*  --   --   --   PLT 202  --   --   --   CREATININE 0.33* 0.39*  --  0.31*  MG  --  1.9  --  1.7  PHOS  --  1.8* 4.1 2.2*   Estimated Creatinine Clearance: 41.5 mL/min (by C-G formula based on Cr of 0.31).  No results for input(s): GLUCAP in the last 72 hours.  Microbiology: Recent Results (from the past 720 hour(s))  MRSA PCR Screening     Status: None   Collection Time: 09/04/14  6:30 AM  Result Value Ref Range Status   MRSA by PCR NEGATIVE NEGATIVE Final    Comment:        The GeneXpert MRSA Assay (FDA approved for NASAL specimens only), is one component of a comprehensive MRSA colonization surveillance program. It is not intended to diagnose MRSA infection nor to guide or monitor treatment for MRSA infections.     Medications:  Scheduled:  . aspirin EC  81 mg Oral Daily  . cefTRIAXone (ROCEPHIN) IVPB 1 gram/50 mL D5W  1 g Intravenous Q24H  . docusate sodium  100 mg Oral BID  . enoxaparin (LOVENOX) injection  40 mg Subcutaneous Q24H  . furosemide  20 mg Oral Daily  . nicotine  7 mg Transdermal Daily  . potassium & sodium phosphates  2 packet Oral TID AC & HS  . predniSONE   50 mg Oral Q breakfast  . tiotropium  18 mcg Inhalation Daily   Infusions:     Assessment: 79 yo female ICU patient admitted for hip fracture now with respiratory failure. Patient developed probable allergy to levofloxacin (rash, SOB) on 7/14.   Plan:   1. Ceftriaxone: Will continue patient on ceftriaxone 1g IV q24hr.   2. Electrolytes: Electrolytes wnl except Phos, will replace orally with NeutraPhos 2 packets x 3 doses. Recheck with AM labs  Pharmacy will continue to monitor and adjust per consult.   Carlen Rebuck C 09/08/2014,8:25 AM

## 2014-09-08 NOTE — Care Management Important Message (Signed)
Important Message  Patient Details  Name: Brenda Higgins MRN: [id number] Date of Birth: 1935/08/03   Medicare Important Message Given:  Yes-second notification given    Olegario Messier A Allmond 09/08/2014, 10:48 AM

## 2014-09-08 NOTE — Clinical Social Work Placement (Signed)
   CLINICAL SOCIAL WORK PLACEMENT  NOTE  Date:  09/08/2014  Patient Details  Name: Brenda Higgins MRN: [id number] Date of Birth: [birth year]  Clinical Social Work is seeking post-discharge placement for this patient at the Skilled  Nursing Facility level of care (*CSW will initial, date and re-position this form in  chart as items are completed):  Yes   Patient/family provided with [facility] Clinical Social Work Department's list of facilities offering this level of care within the geographic area requested by the patient (or if unable, by the patient's family).  Yes   Patient/family informed of their freedom to choose among providers that offer the needed level of care, that participate in Medicare, Medicaid or managed care program needed by the patient, have an available bed and are willing to accept the patient.  Yes   Patient/family informed of [facility]'s ownership interest in [facility] and [facility], as well as of the fact that they are under no obligation to receive care at these facilities.  PASRR submitted to EDS on       PASRR number received on       Existing PASRR number confirmed on 09/07/14     FL2 transmitted to all facilities in geographic area requested by pt/family on 09/07/14     FL2 transmitted to all facilities within larger geographic area on       Patient informed that his/her managed care company has contracts with or will negotiate with certain facilities, including the following:        Yes   Patient/family informed of bed offers received.  Patient chooses bed at [facility]     Physician recommends and patient chooses bed at      Patient to be transferred to [facility] on 09/08/14.  Patient to be transferred to facility by EMS     Patient family notified on 09/08/14 of transfer.  Name of family member notified:  pt's brother was at bedside     PHYSICIAN       Additional Comment:     _______________________________________________ Delight Stare, LCSW 09/08/2014, 12:39 PM

## 2014-09-08 NOTE — Progress Notes (Addendum)
Physical Therapy Treatment Patient Details Name: Brenda Higgins MRN: [id number] DOB: [birth year] Today's Date: 09/08/2014    History of Present Illness This patient is a 79 year old female who came to [facility] after a fall suffering a fractured left hip and has an ORIF repair.    PT Comments    Pt continues to require heavy cueing and assist due to poor safety and anxiety with stand and transfers. Pt up in chair and anxious regarding how she will get back to bed. Encouraged pt to remain up in chair through lunch and PT would assist her back to bed. Explained to pt that if need be should could call nursing (explained process to do so) if she needed to return sooner. Plan to see pt this pm. Post initial note, pt now has discharge orders pending.   Follow Up Recommendations  SNF     Equipment Recommendations       Recommendations for Other Services       Precautions / Restrictions Precautions Precautions: Fall Restrictions Weight Bearing Restrictions: Yes LLE Weight Bearing: Weight bearing as tolerated    Mobility  Bed Mobility Overal bed mobility: Needs Assistance Bed Mobility: Supine to Sit     Supine to sit: Mod assist     General bed mobility comments: once sitting edge of bed; leans R needing assist to maintain upright posture. also forward flexed  Transfers Overall transfer level: Needs assistance Equipment used: Rolling walker (2 wheeled) Transfers: Sit to/from Stand Sit to Stand: Min assist         General transfer comment: poor manuevering of rw; retropulsive and R lateral lean. Several attempts before able to take small steps to chair. Once to chair uncontrolled descent to chair.   Ambulation/Gait Ambulation/Gait assistance: Mod assist Ambulation Distance (Feet): 3 Feet Assistive device: Rolling walker (2 wheeled) Gait Pattern/deviations: Leaning posteriorly;Step-to pattern;Trunk flexed (R lean )         Stairs            Wheelchair Mobility     Modified Rankin (Stroke Patients Only)       Balance                                    Cognition Arousal/Alertness: Awake/alert Behavior During Therapy: Anxious (Mildly with transfer) Overall Cognitive Status: Within Functional Limits for tasks assessed                      Exercises      General Comments        Pertinent Vitals/Pain Pain Assessment: 0-10 Pain Score: 4  Pain Location: L hip    Home Living                      Prior Function            PT Goals (current goals can now be found in the care plan section) Progress towards PT goals: Progressing toward goals    Frequency  BID    PT Plan Current plan remains appropriate    Co-evaluation             End of Session Equipment Utilized During Treatment: Gait belt Activity Tolerance:  (anxiety and poor safety limits pt) Patient left: in chair;with call bell/phone within reach;with chair alarm set     Time: 1131-1152 PT Time Calculation (min) (ACUTE ONLY): 21 min  Charges:  $Gait Training: 8-22 mins                    G Codes:      [name] 09/08/2014, 12:16 PM

## 2014-09-08 NOTE — Progress Notes (Signed)
Patient VSS stable. Being discharged this afternoon to [facility]. Report called to [place]. EMS called.  IV's removed & patient belongings packed.

## 2014-09-09 LAB — VITAMIN D 1,25 DIHYDROXY
Vitamin D 1, 25 (OH)2 Total: 59 pg/mL
Vitamin D2 1, 25 (OH)2: <10 pg/mL
Vitamin D3 1, 25 (OH)2: 59 pg/mL

## 2017-05-19 ENCOUNTER — Emergency Department: Payer: Medicare Other

## 2017-05-19 ENCOUNTER — Emergency Department
Admission: EM | Admit: 2017-05-19 | Discharge: 2017-05-19 | Disposition: A | Discharge status: Home / Self Care | Payer: Medicare Other | Attending: Emergency Medicine | Admitting: Emergency Medicine

## 2017-05-19 ENCOUNTER — Ambulatory Visit
Admission: EM | Admit: 2017-05-19 | Discharge: 2017-05-19 | Disposition: A | Discharge status: Other Acute Inpatient Hospital | Payer: Medicare Other | Source: Home / Self Care | Attending: Emergency Medicine | Admitting: Emergency Medicine

## 2017-05-19 ENCOUNTER — Encounter: Payer: Self-pay | Admitting: *Deleted

## 2017-05-19 ENCOUNTER — Other Ambulatory Visit: Payer: Self-pay

## 2017-05-19 ENCOUNTER — Encounter: Payer: Self-pay | Admitting: Emergency Medicine

## 2017-05-19 DIAGNOSIS — Y939 Activity, unspecified: Secondary | ICD-10-CM | POA: Diagnosis not present

## 2017-05-19 DIAGNOSIS — S0083XA Contusion of other part of head, initial encounter: Secondary | ICD-10-CM | POA: Diagnosis not present

## 2017-05-19 DIAGNOSIS — F17210 Nicotine dependence, cigarettes, uncomplicated: Secondary | ICD-10-CM | POA: Diagnosis not present

## 2017-05-19 DIAGNOSIS — Y929 Unspecified place or not applicable: Secondary | ICD-10-CM | POA: Insufficient documentation

## 2017-05-19 DIAGNOSIS — W0110XA Fall on same level from slipping, tripping and stumbling with subsequent striking against unspecified object, initial encounter: Secondary | ICD-10-CM | POA: Insufficient documentation

## 2017-05-19 DIAGNOSIS — W19XXXA Unspecified fall, initial encounter: Secondary | ICD-10-CM

## 2017-05-19 DIAGNOSIS — S62102A Fracture of unspecified carpal bone, left wrist, initial encounter for closed fracture: Secondary | ICD-10-CM | POA: Diagnosis not present

## 2017-05-19 DIAGNOSIS — J449 Chronic obstructive pulmonary disease, unspecified: Secondary | ICD-10-CM | POA: Diagnosis not present

## 2017-05-19 DIAGNOSIS — I5033 Acute on chronic diastolic (congestive) heart failure: Secondary | ICD-10-CM | POA: Diagnosis not present

## 2017-05-19 DIAGNOSIS — S52502A Unspecified fracture of the lower end of left radius, initial encounter for closed fracture: Secondary | ICD-10-CM | POA: Diagnosis not present

## 2017-05-19 DIAGNOSIS — S098XXA Other specified injuries of head, initial encounter: Secondary | ICD-10-CM | POA: Diagnosis not present

## 2017-05-19 DIAGNOSIS — Y999 Unspecified external cause status: Secondary | ICD-10-CM | POA: Diagnosis not present

## 2017-05-19 DIAGNOSIS — S52602A Unspecified fracture of lower end of left ulna, initial encounter for closed fracture: Secondary | ICD-10-CM | POA: Insufficient documentation

## 2017-05-19 DIAGNOSIS — S59912A Unspecified injury of left forearm, initial encounter: Secondary | ICD-10-CM | POA: Diagnosis present

## 2017-05-19 LAB — CBC
HEMATOCRIT: 39.3 % (ref 35.0–47.0)
Hemoglobin: 12.7 g/dL (ref 12.0–16.0)
MCH: 28.8 pg (ref 26.0–34.0)
MCHC: 32.2 g/dL (ref 32.0–36.0)
MCV: 89.4 fL (ref 80.0–100.0)
Platelets: 248 10*3/uL (ref 150–440)
RBC: 4.4 MIL/uL (ref 3.80–5.20)
RDW: 12.8 % (ref 11.5–14.5)
WBC: 13.7 10*3/uL — ABNORMAL HIGH (ref 3.6–11.0)

## 2017-05-19 LAB — BASIC METABOLIC PANEL
Anion gap: 12 (ref 5–15)
BUN: 18 mg/dL (ref 6–20)
CHLORIDE: 94 mmol/L — AB (ref 101–111)
CO2: 31 mmol/L (ref 22–32)
Calcium: 9.5 mg/dL (ref 8.9–10.3)
Creatinine, Ser: 0.42 mg/dL — ABNORMAL LOW (ref 0.44–1.00)
GFR calc non Af Amer: >60 mL/min (ref >60)
Glucose, Bld: 107 mg/dL — ABNORMAL HIGH (ref 65–99)
POTASSIUM: 3.6 mmol/L (ref 3.5–5.1)
SODIUM: 137 mmol/L (ref 135–145)

## 2017-05-19 LAB — URINALYSIS, COMPLETE (UACMP) WITH MICROSCOPIC
BACTERIA UA: NONE SEEN
BILIRUBIN URINE: NEGATIVE
Glucose, UA: NEGATIVE mg/dL
HGB URINE DIPSTICK: NEGATIVE
Ketones, ur: 5 mg/dL — AB
NITRITE: NEGATIVE
PROTEIN: 30 mg/dL — AB
Specific Gravity, Urine: 1.018 (ref 1.005–1.030)
pH: 5 (ref 5.0–8.0)

## 2017-05-19 MED ORDER — ACETAMINOPHEN 500 MG PO TABS
1000.0000 mg | ORAL_TABLET | Freq: Once | ORAL | Status: AC
  Administered 2017-05-19: 1000 mg via ORAL
  Filled 2017-05-19: qty 2

## 2017-05-19 MED ORDER — OXYCODONE-ACETAMINOPHEN 5-325 MG PO TABS: 1.0000 | ORAL_TABLET | Freq: Four times a day (QID) | ORAL | 0 refills | Status: DC | PRN

## 2017-05-19 NOTE — ED Provider Notes (Addendum)
[facility] Emergency Department Provider Note  ____________________________________________  Time seen: Approximately 4:54 PM  I have reviewed the triage vital signs and the nursing notes.   HISTORY  Chief Complaint No chief complaint on file.    HPI Brenda Higgins is a 82 y.o. female with a history of COPD on exogenous O2, gait instability with recurrent falls, presenting for a fall.  The patient is right-handed.  The patient reports that she was in the kitchen trying to get some food when she tripped, falling on her left arm, and hitting her head.  She did not lose consciousness and denies any headache, nausea or vomiting, changes in vision or speech, changes in mental status, numbness tingling or weakness, but does have some bruising on the face.  She denies any neck or back pain.  She does have some acute on chronic left hip pain.  She denies any associated chest pain, shortness of breath, palpitations or recent illness.  She is not anticoagulated and takes a baby aspirin only.  Past Medical History:  Diagnosis Date  . Chronic respiratory failure (HCC)   . COPD (chronic obstructive pulmonary disease) (HCC)   . Hyperlipidemia     Patient Active Problem List   Diagnosis Date Noted  . COPD exacerbation (HCC) 09/08/2014  . Acute on chronic respiratory failure (HCC) 09/08/2014  . Acute on chronic diastolic CHF (congestive heart failure), NYHA class 1 (HCC) 09/08/2014  . Diastolic CHF (HCC) 09/08/2014  . Hip fracture (HCC) 09/04/2014    Past Surgical History:  Procedure Laterality Date  . HIP SURGERY Right   . INTRAMEDULLARY (IM) NAIL INTERTROCHANTERIC Left 09/05/2014   Procedure: INTRAMEDULLARY (IM) NAIL INTERTROCHANTRIC;  Surgeon: Kennedy Bucker, MD;  Location: [facility] ORS;  Service: Orthopedics;  Laterality: Left;    Current Outpatient Rx  . Order #: [id number] Class: Historical Med  . Order #: [id number] Class: Historical Med  . Order #: [id number] Class:  Historical Med  . Order #: [id number] Class: Historical Med  . Order #: [id number] Class: No Print  . Order #: [id number] Class: No Print  . Order #: [id number] Class: Historical Med  . Order #: [id number] Class: No Print  . Order #: [id number] Class: Historical Med  . Order #: [id number] Class: Print  . Order #: [id number] Class: Historical Med  . Order #: [id number] Class: No Print    Allergies Levofloxacin and Amoxicillin-pot clavulanate  Family History  Problem Relation Age of Onset  . Coronary artery disease Father        And mother    Social History Social History   Tobacco Use  . Smoking status: Current Every Day Smoker    Packs/day: 0.50    Types: Cigarettes  . Smokeless tobacco: Never Used  Substance Use Topics  . Alcohol use: No  . Drug use: No    Review of Systems Constitutional: No fever/chills.  Positive mechanical fall without loss of consciousness.  No lightheadedness or syncope. Eyes: No visual changes.  No blurred or double vision. HEAD: Positive face trauma. ENT: No sore throat. No congestion or rhinorrhea.  No dental injury. Cardiovascular: Denies chest pain. Denies palpitations. Respiratory: Denies shortness of breath.  No cough. Gastrointestinal: No abdominal pain.  No nausea, no vomiting.  No diarrhea.  No constipation. Genitourinary: Negative for dysuria. Musculoskeletal: Negative for back pain.  No neck pain.  Positive left hip pain.  Positive left arm and wrist pain. Skin: Negative for rash. Neurological: Negative for headaches. No focal numbness, tingling or weakness.  No changes in mental status  ____________________________________________   PHYSICAL EXAM:  VITAL SIGNS: ED Triage Vitals  Enc Vitals Group     BP 05/19/17 1433 106/61     Pulse Rate 05/19/17 1433 89     Resp 05/19/17 1433 16     Temp 05/19/17 1433 98.1 F (36.7 C)     Temp Source 05/19/17 1433 Oral     SpO2 05/19/17 1433 95 %     Weight 05/19/17 1430 95 lb (43.1 kg)     Height  05/19/17 1430 5\' 2"  (1.575 m)     Head Circumference --      Peak Flow --      Pain Score --      Pain Loc --      Pain Edu? --      Excl. in GC? --     Constitutional: The patient is chronically ill-appearing but alert and oriented x3.  Answers questions appropriately.  GCS is 15. Eyes: Conjunctivae are normal.  EOMI. PERRLA.  No evidence of entrapment.  No raccoon eyes.  No scleral icterus. Head: The patient has a 2 x 3 inch area of bruising on the superior lateral orbit.. Nose: No congestion/rhinnorhea.  No swelling over the nose.  No septal hematoma. Mouth/Throat: Mucous membranes are moist.  No dental injury or malocclusion. Neck: No stridor.  Supple.  No midline C-spine tenderness to palpation, step-offs or deformities. Cardiovascular: Normal rate, regular rhythm. No murmurs, rubs or gallops.  Respiratory: Prolonged expiratory phase.  No accessory muscle use or retractions. Lungs CTAB.  No wheezes, rales or ronchi. Gastrointestinal: Soft, nontender and nondistended.  No guarding or rebound.  No peritoneal signs.  The patient has normal oxygen saturations of 95% or greater on her home O2. Musculoskeletal: Pelvis is stable.  The patient reports pain with range of motion of the left hip.  She also has deformity and bruising from the mid left forearm to the knuckles on the left arm.  She has 5 out of 5 grip strength in the left hand.  She has normal left radial pulse.  Normal sensation to light touch in the left upper extremity she has full range of motion of the bilateral ankles, knees, right hip, right wrist, bilateral elbows and shoulders without pain.   Neurologic:  A&Ox3.  Speech is clear.  Face and smile are symmetric.  EOMI.  Moves all extremities well. Skin:  Skin is warm, dry Psychiatric: Mood and affect are normal. Speech and behavior are normal.  Normal judgement.  ____________________________________________   LABS (all labs ordered are listed, but only abnormal results are  displayed)  Labs Reviewed  CBC - Abnormal; Notable for the following components:      Result Value   WBC 13.7 (*)    All other components within normal limits  BASIC METABOLIC PANEL - Abnormal; Notable for the following components:   Chloride 94 (*)    Glucose, Bld 107 (*)    Creatinine, Ser 0.42 (*)    All other components within normal limits  URINALYSIS, COMPLETE (UACMP) WITH MICROSCOPIC - Abnormal; Notable for the following components:   Color, Urine YELLOW (*)    APPearance CLEAR (*)    Ketones, ur 5 (*)    Protein, ur 30 (*)    Leukocytes, UA TRACE (*)    Squamous Epithelial / LPF 0-5 (*)    All other components within normal limits   ____________________________________________  EKG  ED ECG REPORT I, Rockne Menghini, the attending physician, personally viewed and interpreted  this ECG.   Date: 05/19/2017  EKG Time: 1738  Rate: 82  Rhythm: normal sinus rhythm  Axis: normal  Intervals:none  ST&T Change: NO STEMI  ____________________________________________  RADIOLOGY  Dg Wrist Complete Left  Result Date: 05/19/2017 CLINICAL DATA:  Fall with pain and swelling EXAM: LEFT WRIST - COMPLETE 3+ VIEW COMPARISON:  None. FINDINGS: Acute comminuted, impacted intra-articular fracture of the distal radius with about 1/3 bone with of dorsal displacement of distal fracture fragment. Acute comminuted and impacted distal ulna fracture with about 1/2 bone with of dorsal displacement and moderate dorsal angulation of distal fracture fragment. IMPRESSION: 1. Acute comminuted impacted and displaced intra-articular distal radius fracture 2. Acute comminuted impacted and displaced/angulated distal ulna fracture Electronically Signed   By: Jasmine Pang M.D.   On: 05/19/2017 14:58   Ct Head Wo Contrast  Result Date: 05/19/2017 CLINICAL DATA:  Head injury and neck pain after fall today. EXAM: CT HEAD WITHOUT CONTRAST CT CERVICAL SPINE WITHOUT CONTRAST TECHNIQUE: Multidetector CT  imaging of the head and cervical spine was performed following the standard protocol without intravenous contrast. Multiplanar CT image reconstructions of the cervical spine were also generated. COMPARISON:  None. FINDINGS: CT HEAD FINDINGS Brain: Mild chronic ischemic white matter disease is noted. No mass effect or midline shift is noted. Ventricular size is within normal limits. There is no evidence of mass lesion, hemorrhage or acute infarction. Vascular: No hyperdense vessel or unexpected calcification. Skull: Normal. Negative for fracture or focal lesion. Sinuses/Orbits: No acute finding. Other: None. CT CERVICAL SPINE FINDINGS Alignment: Minimal grade 1 anterolisthesis of C4-5 is noted secondary to posterior facet joint hypertrophy. Skull base and vertebrae: No acute fracture. No primary bone lesion or focal pathologic process. Soft tissues and spinal canal: No prevertebral fluid or swelling. No visible canal hematoma. Disc levels: Mild degenerative disc disease is noted at C5-6. Severe degenerative disc disease is noted at C6-7. Upper chest: Emphysematous disease is noted in the visualized lungs. Other: Degenerative changes seen involving the posterior facet joints. IMPRESSION: Mild chronic ischemic white matter disease. No acute intracranial abnormality seen. Multilevel degenerative disc disease. No acute abnormality seen in the cervical spine. Emphysema (ICD10-J43.9). Electronically Signed   By: Lupita Raider, M.D.   On: 05/19/2017 17:27   Ct Cervical Spine Wo Contrast  Result Date: 05/19/2017 CLINICAL DATA:  Head injury and neck pain after fall today. EXAM: CT HEAD WITHOUT CONTRAST CT CERVICAL SPINE WITHOUT CONTRAST TECHNIQUE: Multidetector CT imaging of the head and cervical spine was performed following the standard protocol without intravenous contrast. Multiplanar CT image reconstructions of the cervical spine were also generated. COMPARISON:  None. FINDINGS: CT HEAD FINDINGS Brain: Mild chronic  ischemic white matter disease is noted. No mass effect or midline shift is noted. Ventricular size is within normal limits. There is no evidence of mass lesion, hemorrhage or acute infarction. Vascular: No hyperdense vessel or unexpected calcification. Skull: Normal. Negative for fracture or focal lesion. Sinuses/Orbits: No acute finding. Other: None. CT CERVICAL SPINE FINDINGS Alignment: Minimal grade 1 anterolisthesis of C4-5 is noted secondary to posterior facet joint hypertrophy. Skull base and vertebrae: No acute fracture. No primary bone lesion or focal pathologic process. Soft tissues and spinal canal: No prevertebral fluid or swelling. No visible canal hematoma. Disc levels: Mild degenerative disc disease is noted at C5-6. Severe degenerative disc disease is noted at C6-7. Upper chest: Emphysematous disease is noted in the visualized lungs. Other: Degenerative changes seen involving the posterior facet joints. IMPRESSION: Mild  chronic ischemic white matter disease. No acute intracranial abnormality seen. Multilevel degenerative disc disease. No acute abnormality seen in the cervical spine. Emphysema (ICD10-J43.9). Electronically Signed   By: Lupita Raider, M.D.   On: 05/19/2017 17:27   Dg Hand Complete Left  Result Date: 05/19/2017 CLINICAL DATA:  Fall with hand injury EXAM: LEFT HAND - COMPLETE 3+ VIEW COMPARISON:  05/19/2017 FINDINGS: Interval casting of the left wrist and hand. Comminuted impacted intra-articular distal radius and ulna fractures with slight decreased displacement and angulation of the distal ulna fracture. IMPRESSION: Comminuted impacted intra-articular distal radius and ulna fractures with slight decreased displacement and angulation of the distal ulna fracture. Electronically Signed   By: Jasmine Pang M.D.   On: 05/19/2017 17:45   Dg Hip Unilat W Or Wo Pelvis 2-3 Views Left  Result Date: 05/19/2017 CLINICAL DATA:  Larey Seat and landed on left side with left hip pain not able to  bear weight EXAM: DG HIP (WITH OR WITHOUT PELVIS) 2-3V LEFT COMPARISON:  09/04/2014 FINDINGS: Status post bilateral intramedullary rodding of the femurs. The pubic symphysis and rami are intact. Old trochanteric fracture on the left. No acute displaced fracture or malalignment is seen. There are vascular calcifications. IMPRESSION: Status post intramedullary rod fixation of both femurs with old left trochanteric fracture. No definite acute osseous abnormality Electronically Signed   By: Jasmine Pang M.D.   On: 05/19/2017 17:43    ____________________________________________   PROCEDURES  Procedure(s) performed: None  .Splint Application Date/Time: 05/19/2017 6:42 PM Performed by: Rockne Menghini, MD Authorized by: Rockne Menghini, MD   Consent:    Consent obtained:  Verbal   Consent given by:  Patient   Risks discussed:  Discoloration, numbness, pain and swelling   Alternatives discussed:  No treatment Pre-procedure details:    Sensation:  Normal Procedure details:    Laterality:  Left   Location:  Arm   Arm:  L lower arm   Strapping: no     Cast type:  Long arm   Splint type:  Sugar tong   Supplies:  Ortho-Glass Post-procedure details:    Pain:  Improved   Sensation:  Normal   Patient tolerance of procedure:  Tolerated well, no immediate complications    Critical Care performed: No ____________________________________________   INITIAL IMPRESSION / ASSESSMENT AND PLAN / ED COURSE  Pertinent labs & imaging results that were available during my care of the patient were reviewed by me and considered in my medical decision making (see chart for details).  82 y.o. female with a history of multiple chronic illnesses presenting with fall prior to arrival with left arm deformity, facial trauma.  No loss of consciousness.  We will plan to have the patient undergo CT of the head and C-spine.  Her left wrist has She has already been x-rayed and does show acute  comminuted and impacted displaced intra-articular distal radial fracture and a comminuted impacted displaced distal ulna fracture.  I will also x-ray her hand which has a significant amount of bruising.  She will undergo x-ray imaging of her pelvis and left hip.  We will treat her pain.  We will do some basic laboratory studies and EKG.  Plan reevaluation for final disposition.  Dr. Ernest Pine, the orthopedist on call, has been consulted for her wrist fracture.  ----------------------------------------- 6:05 PM on 05/19/2017 -----------------------------------------  The remainder of the patient's workup in the emergency department has been reassuring.  She has no evidence of hip fracture on her x-rays, and  she has good range of motion with minimal pain so additional imaging is not indicated.  Her CT head and C-spine do not show any acute injuries.  Her laboratory studies are reassuring with no evidence of UTI and no significant electrolyte disturbance or blood count disturbance.  The patient has been placed in a sugar tong splint for her isolated left radius and ulnar fractures, and have spoken with Dr. Ernest Pine who will see the patient in the outpatient setting for likely operative management.    At this time, the patient is safe for discharge home.  I have discussed her workup findings and results with her and her family, and she understands follow-up instructions as well as return precautions  ____________________________________________  FINAL CLINICAL IMPRESSION(S) / ED DIAGNOSES  Final diagnoses:  Closed fracture of distal end of left radius, unspecified fracture morphology, initial encounter  Closed fracture of distal end of left ulna, unspecified fracture morphology, initial encounter  Contusion of face, initial encounter  Fall, initial encounter         NEW MEDICATIONS STARTED DURING THIS VISIT:  New Prescriptions   OXYCODONE-ACETAMINOPHEN (PERCOCET) 5-325 MG TABLET    Take 1 tablet  by mouth every 6 (six) hours as needed for severe pain.      Rockne Menghini, MD 05/19/17 1806    Rockne Menghini, MD 05/19/17 [phone]

## 2017-05-19 NOTE — ED Provider Notes (Signed)
HPI  SUBJECTIVE:  Brenda Higgins is a right-handed 82 y.o. female who presents with left wrist and forearm pain, deformity, bruising and swelling after having a mechanical fall at 0900 this morning.  States that she landed on an outstretched hand.  Family member states that she hit her left head on the way down.  She denies loss of consciousness, headache, amnesia, nausea, vomiting.  Patient is acting normally per family member.  She denies neck, chest pain, abdominal or back pain.  No shoulder, proximal arm pain.  Symptoms worse with movement, no alleviating factors.  Has not tried anything for this.  She has a past medical history of COPD on home oxygen, hypertension.  Chart review indicates that she is on Plavix.  Past Medical History:  Diagnosis Date  . Chronic respiratory failure (HCC)   . COPD (chronic obstructive pulmonary disease) (HCC)   . Hyperlipidemia     Past Surgical History:  Procedure Laterality Date  . HIP SURGERY Right   . INTRAMEDULLARY (IM) NAIL INTERTROCHANTERIC Left 09/05/2014   Procedure: INTRAMEDULLARY (IM) NAIL INTERTROCHANTRIC;  Surgeon: Kennedy Bucker, MD;  Location: [facility] ORS;  Service: Orthopedics;  Laterality: Left;    Family History  Problem Relation Age of Onset  . Coronary artery disease Father        And mother    Social History   Tobacco Use  . Smoking status: Current Every Day Smoker    Packs/day: 0.50    Types: Cigarettes  . Smokeless tobacco: Never Used  Substance Use Topics  . Alcohol use: No  . Drug use: No    No current facility-administered medications for this encounter.   Current Outpatient Medications:  .  amLODipine (NORVASC) 5 MG tablet, Take 5 mg by mouth daily., Disp: , Rfl:  .  aspirin EC 81 MG tablet, Take 81 mg by mouth daily., Disp: , Rfl:  .  enoxaparin (LOVENOX) 40 MG/0.4ML injection, Inject 0.4 mLs (40 mg total) into the skin daily., Disp: 0 Syringe, Rfl:  .  furosemide (LASIX) 20 MG tablet, Take 1 tablet (20 mg total) by  mouth daily., Disp: 30 tablet, Rfl: 0 .  ipratropium-albuterol (DUONEB) 0.5-2.5 (3) MG/3ML SOLN, Take 3 mLs by nebulization every 6 (six) hours., Disp: 360 mL, Rfl: 0 .  lovastatin (MEVACOR) 40 MG tablet, Take 40 mg by mouth daily., Disp: , Rfl:  .  albuterol (PROVENTIL HFA) 108 (90 Base) MCG/ACT inhaler, Inhale into the lungs., Disp: , Rfl:  .  ANORO ELLIPTA 62.5-25 MCG/INH AEPB, INHALE 1 INHALATION INTO THE LUNGS ONCE DAILY. DO NOT USE WITH SPIRIVA OR ADVAIR, Disp: , Rfl: 10 .  hydrochlorothiazide (MICROZIDE) 12.5 MG capsule, , Disp: , Rfl:  .  tiotropium (SPIRIVA) 18 MCG inhalation capsule, Place 18 mcg into inhaler and inhale daily., Disp: , Rfl:  .  tiotropium (SPIRIVA) 18 MCG inhalation capsule, Place 1 capsule (18 mcg total) into inhaler and inhale daily., Disp: 30 capsule, Rfl: 0  Allergies  Allergen Reactions  . Levofloxacin Shortness Of Breath and Rash  . Amoxicillin-Pot Clavulanate Diarrhea     ROS  As noted in HPI.   Physical Exam  BP 94/79   Pulse 90   Temp 98.1 F (36.7 C) (Oral)   Ht 5\' 2"  (1.575 m)   Wt 95 lb (43.1 kg)   SpO2 94%   BMI 17.38 kg/m   Constitutional: Well developed, well nourished, no acute distress Eyes: PERRLA, EOMI, conjunctiva normal bilaterally.  No photophobia HENT: Normocephalic, negative large  hematoma on the left forehead.  No facial tenderness, limitation of motion of the jaw or tenderness of the jaw.  Mucus membranes moist Respiratory: Poor respiratory effort, lungs clear bilaterally Cardiovascular: Regular rhythm, no murmurs, rubs, gallops GI: nondistended skin: No rash, skin intact Musculoskeletal: Left RP 2+.  Refill less than 2 seconds.  Sensation grossly intact over the hand.  Positive obvious deformity and laxity of the left wrist.  Positive bruising, swelling over the distal forearm and entire hand.  Tenderness over the distal forearm along the radius and ulna, tenderness over the hand.  Patient able to move all fingers.  Elbow,  proximal arm, shoulder normal.  No C-spine, T-spine, L-spine tenderness. Neurologic: Alert & oriented x 3, no focal neuro deficits Psychiatric: Speech and behavior appropriate   ED Course   Medications - No data to display  Orders Placed This Encounter  Procedures  . Apply other splint    Standing Status:   Standing    Number of Occurrences:   1    Order Specific Question:   Laterality    Answer:   Left    No results found for this or any previous visit (from the past 24 hour(s)). No results found.  ED Clinical Impression  Fall, initial encounter  Contusion of face, initial encounter  Closed fracture of left wrist, initial encounter   ED Assessment/Plan  Concern for displaced Colles' fracture that would require specialty evaluation today, she is neurovascularly intact, feel that she is stable to go by private vehicle.  She also has a head contusion, but no signs of a concussion or C-spine injury.  She states that she is not on the Plavix.  placing in splint, and transferring to the [facility] ER for comprehensive evaluation.  Advised her to not have anything to eat or drink until her evaluation is complete.  Also discussed this with the family member.  Notified triage nurse.  Discussed rationale for transfer to the ER with patient and family member, they agree to go immediately there.   No orders of the defined types were placed in this encounter.   *This clinic note was created using Dragon dictation software. Therefore, there may be occasional mistakes despite careful proofreading.   ?   Domenick Gong, MD 05/19/17 1339

## 2017-05-19 NOTE — ED Notes (Signed)
Reports trip and fall in kitchen this AM. Left arm pain and swelling since. Pt alert and oriented X4, active, cooperative, pt in NAD. RR even and unlabored, color WNL.

## 2017-05-19 NOTE — Discharge Instructions (Signed)
Today, you broke both the bones in your wrist.  Please keep your splint on at all times, even when you are sleeping or bathing.  Please keep your arm elevated above the level of your heart as much as possible.  You may also ice your wrist for 15 minutes every 2 hours to decrease swelling and pain.  You may take Tylenol or Motrin for mild to moderate pain and Percocet is for severe pain.  Please be careful taking Percocet as it can make you more unsteady when you walk and to request assistance if you need to walk.  You may not drive within 8 hours of taking Percocet.  Return to the emergency department if you develop severe pain, lightheadedness or fainting, numbness tingling or weakness, or any other symptoms concerning to you.

## 2017-05-19 NOTE — ED Notes (Signed)
Splint applied by ED tech. Family at bedside.

## 2017-05-19 NOTE — Discharge Instructions (Addendum)
Immediately to the [facility] ER.  They are expecting you.  Do not have anything to eat or drink until your evaluation is complete.  Let them know if you start to have a headache, nausea, if you throw up, if lights start to bother your eyes, or for other concerns

## 2017-05-19 NOTE — ED Triage Notes (Signed)
PAtient fell today when she turned around to fast injuring her left lower arm and wrist. No previous history of left wrist injury. A bruise is visible on the left side of the forehead.

## 2017-05-19 NOTE — ED Notes (Signed)
Patient transported to CT 

## 2017-05-19 NOTE — ED Triage Notes (Signed)
Fall this morning at 0900-- seen through Urgent Care in [facility], and patient referred to ED for evaluation.  Sent for evaluation of possible broken right arm and head injury.

## 2018-01-25 ENCOUNTER — Emergency Department
Admission: EM | Admit: 2018-01-25 | Discharge: 2018-01-26 | Disposition: A | Discharge status: Home / Self Care | Payer: Medicare Other | Attending: Emergency Medicine | Admitting: Emergency Medicine

## 2018-01-25 ENCOUNTER — Other Ambulatory Visit: Payer: Self-pay

## 2018-01-25 ENCOUNTER — Encounter: Payer: Self-pay | Admitting: Emergency Medicine

## 2018-01-25 ENCOUNTER — Emergency Department: Payer: Medicare Other

## 2018-01-25 DIAGNOSIS — Z79899 Other long term (current) drug therapy: Secondary | ICD-10-CM | POA: Insufficient documentation

## 2018-01-25 DIAGNOSIS — Z7982 Long term (current) use of aspirin: Secondary | ICD-10-CM | POA: Insufficient documentation

## 2018-01-25 DIAGNOSIS — Y998 Other external cause status: Secondary | ICD-10-CM | POA: Diagnosis not present

## 2018-01-25 DIAGNOSIS — J449 Chronic obstructive pulmonary disease, unspecified: Secondary | ICD-10-CM | POA: Diagnosis not present

## 2018-01-25 DIAGNOSIS — X58XXXA Exposure to other specified factors, initial encounter: Secondary | ICD-10-CM | POA: Insufficient documentation

## 2018-01-25 DIAGNOSIS — F0390 Unspecified dementia without behavioral disturbance: Secondary | ICD-10-CM | POA: Insufficient documentation

## 2018-01-25 DIAGNOSIS — R410 Disorientation, unspecified: Secondary | ICD-10-CM

## 2018-01-25 DIAGNOSIS — F17210 Nicotine dependence, cigarettes, uncomplicated: Secondary | ICD-10-CM | POA: Diagnosis not present

## 2018-01-25 DIAGNOSIS — Y9389 Activity, other specified: Secondary | ICD-10-CM | POA: Insufficient documentation

## 2018-01-25 DIAGNOSIS — S51811A Laceration without foreign body of right forearm, initial encounter: Secondary | ICD-10-CM | POA: Diagnosis not present

## 2018-01-25 DIAGNOSIS — Y929 Unspecified place or not applicable: Secondary | ICD-10-CM | POA: Diagnosis not present

## 2018-01-25 DIAGNOSIS — S59911A Unspecified injury of right forearm, initial encounter: Secondary | ICD-10-CM | POA: Diagnosis present

## 2018-01-25 LAB — COMPREHENSIVE METABOLIC PANEL WITH GFR
ALT: 15 U/L (ref 0–44)
AST: 19 U/L (ref 15–41)
Albumin: 3.9 g/dL (ref 3.5–5.0)
Alkaline Phosphatase: 69 U/L (ref 38–126)
Anion gap: 9 (ref 5–15)
BUN: 24 mg/dL — ABNORMAL HIGH (ref 8–23)
CO2: 35 mmol/L — ABNORMAL HIGH (ref 22–32)
Calcium: 9.6 mg/dL (ref 8.9–10.3)
Chloride: 98 mmol/L (ref 98–111)
Creatinine, Ser: 0.44 mg/dL (ref 0.44–1.00)
GFR calc Af Amer: >60 mL/min
GFR calc non Af Amer: >60 mL/min
Glucose, Bld: 105 mg/dL — ABNORMAL HIGH (ref 70–99)
Potassium: 3.5 mmol/L (ref 3.5–5.1)
Sodium: 142 mmol/L (ref 135–145)
Total Bilirubin: 0.3 mg/dL (ref 0.3–1.2)
Total Protein: 7 g/dL (ref 6.5–8.1)

## 2018-01-25 LAB — URINALYSIS, ROUTINE W REFLEX MICROSCOPIC
BILIRUBIN URINE: NEGATIVE
GLUCOSE, UA: NEGATIVE mg/dL
HGB URINE DIPSTICK: NEGATIVE
KETONES UR: NEGATIVE mg/dL
NITRITE: NEGATIVE
PROTEIN: 30 mg/dL — AB
Specific Gravity, Urine: 1.02 (ref 1.005–1.030)
pH: 6 (ref 5.0–8.0)

## 2018-01-25 LAB — CBC
HCT: 41.9 % (ref 36.0–46.0)
HEMOGLOBIN: 12.9 g/dL (ref 12.0–15.0)
MCH: 29 pg (ref 26.0–34.0)
MCHC: 30.8 g/dL (ref 30.0–36.0)
MCV: 94.2 fL (ref 80.0–100.0)
PLATELETS: 191 10*3/uL (ref 150–400)
RBC: 4.45 MIL/uL (ref 3.87–5.11)
RDW: 12.3 % (ref 11.5–15.5)
WBC: 6.7 10*3/uL (ref 4.0–10.5)
nRBC: 0 % (ref 0.0–0.2)

## 2018-01-25 LAB — BLOOD GAS, VENOUS
Acid-Base Excess: 17.1 mmol/L — ABNORMAL HIGH (ref 0.0–2.0)
Bicarbonate: 45.1 mmol/L — ABNORMAL HIGH (ref 20.0–28.0)
O2 Saturation: 28.5 %
Patient temperature: 37
pCO2, Ven: 68 mmHg — ABNORMAL HIGH (ref 44.0–60.0)
pH, Ven: 7.43 (ref 7.250–7.430)
pO2, Ven: <31 mmHg — CL (ref 32.0–45.0)

## 2018-01-25 MED ORDER — FAMOTIDINE 20 MG PO TABS: 20.0000 mg | ORAL_TABLET | Freq: Every day | ORAL | 1 refills | Status: DC

## 2018-01-25 NOTE — ED Triage Notes (Addendum)
Pt to ED with lac to RT forearm after mechanical fall today. Laceration noted to  Per son, pt has been having hallucinations of people coming into home xfew days. Pt A&OX3, VSS, pt hx of COPD and on chronic o2 . Skin tear noted, this RN was unable to take off 4x4 guaze due to sticking to tear, wet guaze applied and curlex at this time

## 2018-01-25 NOTE — ED Provider Notes (Signed)
[facility] Emergency Department Provider Note  ____________________________________________  Time seen: Approximately 10:40 PM  I have reviewed the triage vital signs and the nursing notes.   HISTORY  Chief Complaint Laceration   HPI Brenda Higgins is a 82 y.o. female with a history of COPD, chronic respiratory failure on 3 L nasal cannula, dementia who presents for evaluation of laceration on her right forearm.  Patient reports that she was sitting on a chair when she reached out on the ground to grab something and sustained a laceration in her forearm.  She is not sure exactly how that happened.  When her son came home from work today she found her arm with the skin tear.  She denies fall.  She has no pain in her arm, head, neck, back, or any other extremity.  Last tetanus shot was a few years ago according to the son.  Son reports the patient received a flu shot a few days ago and over the last few days has been slightly more confused including seeing things on the ground which is not common for her.  Past Medical History:  Diagnosis Date  . Chronic respiratory failure (HCC)   . COPD (chronic obstructive pulmonary disease) (HCC)   . Hyperlipidemia     Patient Active Problem List   Diagnosis Date Noted  . COPD exacerbation (HCC) 09/08/2014  . Acute on chronic respiratory failure (HCC) 09/08/2014  . Acute on chronic diastolic CHF (congestive heart failure), NYHA class 1 (HCC) 09/08/2014  . Diastolic CHF (HCC) 09/08/2014  . Hip fracture (HCC) 09/04/2014    Past Surgical History:  Procedure Laterality Date  . HIP SURGERY Right   . INTRAMEDULLARY (IM) NAIL INTERTROCHANTERIC Left 09/05/2014   Procedure: INTRAMEDULLARY (IM) NAIL INTERTROCHANTRIC;  Surgeon: Kennedy Bucker, MD;  Location: [facility] ORS;  Service: Orthopedics;  Laterality: Left;    Prior to Admission medications   Medication Sig Start Date End Date Taking? Authorizing Provider  albuterol  (PROVENTIL HFA) 108 (90 Base) MCG/ACT inhaler Inhale into the lungs.    [provider]  amLODipine (NORVASC) 5 MG tablet Take 5 mg by mouth daily.    [provider]  ANORO ELLIPTA 62.5-25 MCG/INH AEPB INHALE 1 INHALATION INTO THE LUNGS ONCE DAILY. DO NOT USE WITH SPIRIVA OR ADVAIR 05/06/17   [provider]  aspirin EC 81 MG tablet Take 81 mg by mouth daily.    [provider]  enoxaparin (LOVENOX) 40 MG/0.4ML injection Inject 0.4 mLs (40 mg total) into the skin daily. 09/08/14 05/19/17  Milagros Loll, MD  furosemide (LASIX) 20 MG tablet Take 1 tablet (20 mg total) by mouth daily. 09/08/14   Milagros Loll, MD  hydrochlorothiazide (MICROZIDE) 12.5 MG capsule  02/24/17   [provider]  ipratropium-albuterol (DUONEB) 0.5-2.5 (3) MG/3ML SOLN Take 3 mLs by nebulization every 6 (six) hours. 09/08/14   Milagros Loll, MD  lovastatin (MEVACOR) 40 MG tablet Take 40 mg by mouth daily.    [provider]  oxyCODONE-acetaminophen (PERCOCET) 5-325 MG tablet Take 1 tablet by mouth every 6 (six) hours as needed for severe pain. 05/19/17   Rockne Menghini, MD  tiotropium (SPIRIVA) 18 MCG inhalation capsule Place 18 mcg into inhaler and inhale daily.    [provider]  tiotropium (SPIRIVA) 18 MCG inhalation capsule Place 1 capsule (18 mcg total) into inhaler and inhale daily. 09/08/14   Milagros Loll, MD    Allergies Levofloxacin and Amoxicillin-pot clavulanate  Family History  Problem  Relation Age of Onset  . Coronary artery disease Father        And mother    Social History Social History   Tobacco Use  . Smoking status: Current Every Day Smoker    Packs/day: 0.50    Types: Cigarettes  . Smokeless tobacco: Never Used  Substance Use Topics  . Alcohol use: No  . Drug use: No    Review of Systems  Constitutional: Negative for fever. + confusion Eyes: Negative for visual changes. ENT: Negative for sore throat. Neck: No neck  pain  Cardiovascular: Negative for chest pain. Respiratory: Negative for shortness of breath. Gastrointestinal: Negative for abdominal pain, vomiting or diarrhea. Genitourinary: Negative for dysuria. Musculoskeletal: Negative for back pain. Skin: Negative for rash. + laceration Neurological: Negative for headaches, weakness or numbness. Psych: No SI or HI  ____________________________________________   PHYSICAL EXAM:  VITAL SIGNS: ED Triage Vitals  Enc Vitals Group     BP 01/25/18 1857 (!) 154/77     Pulse Rate 01/25/18 1857 87     Resp 01/25/18 1857 14     Temp 01/25/18 1857 98.3 F (36.8 C)     Temp Source 01/25/18 1857 Oral     SpO2 01/25/18 1857 94 %     Weight --      Height --      Head Circumference --      Peak Flow --      Pain Score 01/25/18 1858 0     Pain Loc --      Pain Edu? --      Excl. in GC? --     Constitutional: Alert and oriented x2. Well appearing and in no apparent distress. HEENT:      Head: Normocephalic and atraumatic.         Eyes: Conjunctivae are normal. Sclera is non-icteric.       Mouth/Throat: Mucous membranes are moist.       Neck: Supple with no signs of meningismus.  No C-spine tenderness Cardiovascular: Regular rate and rhythm. No murmurs, gallops, or rubs. 2+ symmetrical distal pulses are present in all extremities. No JVD. Respiratory: Normal respiratory effort. Lungs are clear to auscultation bilaterally. No wheezes, crackles, or rhonchi.  Gastrointestinal: Soft, non tender, and non distended with positive bowel sounds. No rebound or guarding. Musculoskeletal: Nontender with normal range of motion in all extremities. No edema, cyanosis, or erythema of extremities. Neurologic: Normal speech and language. Face is symmetric. Moving all extremities. No gross focal neurologic deficits are appreciated. Skin: Skin is warm, dry and intact. No rash noted. There is a skin tear on the dorsal aspect of the R forearm Psychiatric: Mood and  affect are normal. Speech and behavior are normal.  ____________________________________________   LABS (all labs ordered are listed, but only abnormal results are displayed)  Labs Reviewed  COMPREHENSIVE METABOLIC PANEL - Abnormal; Notable for the following components:      Result Value   CO2 35 (*)    Glucose, Bld 105 (*)    BUN 24 (*)    All other components within normal limits  BLOOD GAS, VENOUS - Abnormal; Notable for the following components:   pCO2, Ven 68 (*)    pO2, Ven <31.0 (*)    Bicarbonate 45.1 (*)    Acid-Base Excess 17.1 (*)    All other components within normal limits  URINE CULTURE  CBC  URINALYSIS, ROUTINE W REFLEX MICROSCOPIC  CBG MONITORING, ED   ____________________________________________  EKG  none  ____________________________________________  RADIOLOGY  I have personally reviewed the images performed during this visit and I agree with the Radiologist's read.   Interpretation by Radiologist:  Dg Forearm Right  Result Date: 01/25/2018 CLINICAL DATA:  Laceration of the right forearm after fall today. EXAM: RIGHT FOREARM - 2 VIEW COMPARISON:  None. FINDINGS: Bandaging artifact is seen of the distal forearm. Reported laceration is therefore radiographically occult not optimally visualized. No radiopaque foreign body is identified. 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 Small ossific density adjacent to the olecranon and triceps insertion likely reflect triceps tendinosis. No acute fracture is identified. The elbow joint is maintained. Slight soft tissue swelling over the posterior aspect of the elbow is noted. IMPRESSION: 1. Soft tissue swelling over the posterior aspect of the elbow. No acute osseous abnormality. 2. No radiopaque foreign body. Reported soft tissue laceration of the forearm is radiographically occult and limited by overlying bandaging  artifact. Electronically Signed   By: Tollie Eth M.D.   On: 01/25/2018 22:47      ____________________________________________   PROCEDURES  Procedure(s) performed: None Procedures Critical Care performed:  None ____________________________________________   INITIAL IMPRESSION / ASSESSMENT AND PLAN / ED COURSE  82 y.o. female with a history of COPD, chronic respiratory failure on 3 L nasal cannula, dementia who presents for evaluation of laceration on her right forearm.  Tetanus is up-to-date.  Patient with very thin skin therefore skin tear was repaired with Steri-Strips and dressing.  Discussed signs of infection and recommended return if these develop with the son.  Patient also having mild visual hallucinations since having a flu shot.  Will check UA to rule out UTI.  The history of chronic respiratory failure will check VBG to rule out CO2 retention. She is neuro intact with normal vitals otherwise.     _________________________ 10:43 PM on 01/25/2018 -----------------------------------------  VBG showed CO slightly elevated with normal pH which is probably chronic for patient. Low PO2 but sats are normal on pulse ox. XR with no fracture. UA pending.    _________________________ 11:11 PM on 01/25/2018 -----------------------------------------  UA pending. Care transferred to DR. York Cerise  As part of my medical decision making, I reviewed the following data within the electronic MEDICAL RECORD NUMBER History obtained from family, Nursing notes reviewed and incorporated, Labs reviewed , Old chart reviewed, Radiograph reviewed , Notes from prior ED visits and [place] Controlled Substance Database    Pertinent labs & imaging results that were available during my care of the patient were reviewed by me and considered in my medical decision making (see chart for details).    ____________________________________________   FINAL CLINICAL IMPRESSION(S) / ED DIAGNOSES  Final diagnoses:    Skin tear of right forearm without complication, initial encounter  Confusion      NEW MEDICATIONS STARTED DURING THIS VISIT:  ED Discharge Orders    None       Note:  This document was prepared using Dragon voice recognition software and may include unintentional dictation errors.    Nita Sickle, MD 01/25/18 2312

## 2018-01-25 NOTE — ED Notes (Signed)
Spoke with pt about wait times and what to expect next. Advised pt that I am available for further questions if needed. Also gave pt a pair of socks for her cold feet.

## 2018-01-25 NOTE — ED Notes (Signed)
Report given to Cole RN 

## 2018-01-25 NOTE — ED Notes (Signed)
Pt informed of needing urine sample. Pt states she doesn't have to pee at this time but will let us know. Family at bedside. Pt given warm blanket.

## 2018-01-26 DIAGNOSIS — S51811A Laceration without foreign body of right forearm, initial encounter: Secondary | ICD-10-CM | POA: Diagnosis not present

## 2018-01-26 MED ORDER — FOSFOMYCIN TROMETHAMINE 3 G PO PACK
3.0000 g | PACK | Freq: Once | ORAL | Status: AC
Start: 2018-01-26 — End: 2018-01-26
  Administered 2018-01-26: 3 g via ORAL
  Filled 2018-01-26: qty 3

## 2018-01-26 NOTE — ED Provider Notes (Signed)
-----------------------------------------   12:44 AM on 01/26/2018 -----------------------------------------  The patient is in no distress.  Urinalysis was questionable for a possible mild UTI.  I sent a urine culture and treated with a one-time dose of fosfomycin.  I updated the patient and her son and they are comfortable with the plan for discharge.  She will follow-up with her primary care doctor at the next available opportunity and I gave my usual customary return precautions.   Loleta Rose, MD 01/26/18 [phone]

## 2018-01-26 NOTE — Discharge Instructions (Signed)
Your workup in the Emergency Department today was reassuring.  We did not find any specific abnormalities.  You have a possible mild urinary tract infection which we treated with a one-time dose of antibiotics called fosfosmycin; you should not need any additional medication.  We recommend you drink plenty of fluids, take your regular medications and/or any new ones prescribed today, and follow up with the doctor(s) listed in these documents as recommended.  Return to the Emergency Department if you develop new or worsening symptoms that concern you.

## 2018-01-27 LAB — URINE CULTURE
Culture: >=100000 — AB
SPECIAL REQUESTS: NORMAL

## 2019-01-10 ENCOUNTER — Emergency Department: Payer: Medicare Other

## 2019-01-10 ENCOUNTER — Encounter: Payer: Self-pay | Admitting: *Deleted

## 2019-01-10 ENCOUNTER — Inpatient Hospital Stay
Admission: EM | Admit: 2019-01-10 | Discharge: 2019-01-15 | DRG: 190 | Disposition: A | Discharge status: Skilled Nursing Facility | Payer: Medicare Other | Attending: Internal Medicine | Admitting: Internal Medicine

## 2019-01-10 DIAGNOSIS — Z7982 Long term (current) use of aspirin: Secondary | ICD-10-CM

## 2019-01-10 DIAGNOSIS — I5033 Acute on chronic diastolic (congestive) heart failure: Secondary | ICD-10-CM | POA: Diagnosis present

## 2019-01-10 DIAGNOSIS — M7989 Other specified soft tissue disorders: Secondary | ICD-10-CM

## 2019-01-10 DIAGNOSIS — J449 Chronic obstructive pulmonary disease, unspecified: Secondary | ICD-10-CM | POA: Diagnosis not present

## 2019-01-10 DIAGNOSIS — J9621 Acute and chronic respiratory failure with hypoxia: Secondary | ICD-10-CM | POA: Diagnosis present

## 2019-01-10 DIAGNOSIS — K5900 Constipation, unspecified: Secondary | ICD-10-CM | POA: Diagnosis present

## 2019-01-10 DIAGNOSIS — I10 Essential (primary) hypertension: Secondary | ICD-10-CM

## 2019-01-10 DIAGNOSIS — Z66 Do not resuscitate: Secondary | ICD-10-CM | POA: Diagnosis present

## 2019-01-10 DIAGNOSIS — R531 Weakness: Secondary | ICD-10-CM | POA: Diagnosis not present

## 2019-01-10 DIAGNOSIS — Z79899 Other long term (current) drug therapy: Secondary | ICD-10-CM

## 2019-01-10 DIAGNOSIS — Z681 Body mass index (BMI) 19 or less, adult: Secondary | ICD-10-CM

## 2019-01-10 DIAGNOSIS — Z880 Allergy status to penicillin: Secondary | ICD-10-CM

## 2019-01-10 DIAGNOSIS — E785 Hyperlipidemia, unspecified: Secondary | ICD-10-CM | POA: Diagnosis present

## 2019-01-10 DIAGNOSIS — Z20828 Contact with and (suspected) exposure to other viral communicable diseases: Secondary | ICD-10-CM | POA: Diagnosis present

## 2019-01-10 DIAGNOSIS — J441 Chronic obstructive pulmonary disease with (acute) exacerbation: Secondary | ICD-10-CM | POA: Diagnosis present

## 2019-01-10 DIAGNOSIS — Z7951 Long term (current) use of inhaled steroids: Secondary | ICD-10-CM

## 2019-01-10 DIAGNOSIS — F17210 Nicotine dependence, cigarettes, uncomplicated: Secondary | ICD-10-CM | POA: Diagnosis present

## 2019-01-10 DIAGNOSIS — D649 Anemia, unspecified: Secondary | ICD-10-CM

## 2019-01-10 DIAGNOSIS — M8580 Other specified disorders of bone density and structure, unspecified site: Secondary | ICD-10-CM

## 2019-01-10 DIAGNOSIS — J9622 Acute and chronic respiratory failure with hypercapnia: Secondary | ICD-10-CM | POA: Diagnosis present

## 2019-01-10 DIAGNOSIS — R1310 Dysphagia, unspecified: Secondary | ICD-10-CM | POA: Diagnosis present

## 2019-01-10 DIAGNOSIS — I081 Rheumatic disorders of both mitral and tricuspid valves: Secondary | ICD-10-CM | POA: Diagnosis present

## 2019-01-10 DIAGNOSIS — Z8249 Family history of ischemic heart disease and other diseases of the circulatory system: Secondary | ICD-10-CM

## 2019-01-10 DIAGNOSIS — I110 Hypertensive heart disease with heart failure: Secondary | ICD-10-CM | POA: Diagnosis present

## 2019-01-10 DIAGNOSIS — E43 Unspecified severe protein-calorie malnutrition: Secondary | ICD-10-CM

## 2019-01-10 DIAGNOSIS — R627 Adult failure to thrive: Secondary | ICD-10-CM | POA: Diagnosis present

## 2019-01-10 DIAGNOSIS — L03116 Cellulitis of left lower limb: Secondary | ICD-10-CM | POA: Diagnosis present

## 2019-01-10 DIAGNOSIS — W19XXXA Unspecified fall, initial encounter: Secondary | ICD-10-CM

## 2019-01-10 DIAGNOSIS — Z881 Allergy status to other antibiotic agents status: Secondary | ICD-10-CM

## 2019-01-10 LAB — BRAIN NATRIURETIC PEPTIDE: B Natriuretic Peptide: 384 pg/mL — ABNORMAL HIGH (ref 0.0–100.0)

## 2019-01-10 LAB — URINALYSIS, COMPLETE (UACMP) WITH MICROSCOPIC
Bacteria, UA: NONE SEEN
Bilirubin Urine: NEGATIVE
Glucose, UA: NEGATIVE mg/dL
Hgb urine dipstick: NEGATIVE
Ketones, ur: NEGATIVE mg/dL
Leukocytes,Ua: NEGATIVE
Nitrite: NEGATIVE
Protein, ur: NEGATIVE mg/dL
Specific Gravity, Urine: 1.011 (ref 1.005–1.030)
pH: 6 (ref 5.0–8.0)

## 2019-01-10 LAB — BASIC METABOLIC PANEL
Anion gap: 15 (ref 5–15)
BUN: 36 mg/dL — ABNORMAL HIGH (ref 8–23)
CO2: 38 mmol/L — ABNORMAL HIGH (ref 22–32)
Calcium: 8.6 mg/dL — ABNORMAL LOW (ref 8.9–10.3)
Chloride: 86 mmol/L — ABNORMAL LOW (ref 98–111)
Creatinine, Ser: 0.54 mg/dL (ref 0.44–1.00)
GFR calc Af Amer: >60 mL/min (ref >60)
GFR calc non Af Amer: >60 mL/min (ref >60)
Glucose, Bld: 125 mg/dL — ABNORMAL HIGH (ref 70–99)
Potassium: 3.6 mmol/L (ref 3.5–5.1)
Sodium: 139 mmol/L (ref 135–145)

## 2019-01-10 LAB — CBC WITH DIFFERENTIAL/PLATELET
Abs Immature Granulocytes: 0.03 10*3/uL (ref 0.00–0.07)
Basophils Absolute: 0 10*3/uL (ref 0.0–0.1)
Basophils Relative: 0 %
Eosinophils Absolute: 0 10*3/uL (ref 0.0–0.5)
Eosinophils Relative: 0 %
HCT: 33.4 % — ABNORMAL LOW (ref 36.0–46.0)
Hemoglobin: 9.4 g/dL — ABNORMAL LOW (ref 12.0–15.0)
Immature Granulocytes: 0 %
Lymphocytes Relative: 6 %
Lymphs Abs: 0.5 10*3/uL — ABNORMAL LOW (ref 0.7–4.0)
MCH: 24.5 pg — ABNORMAL LOW (ref 26.0–34.0)
MCHC: 28.1 g/dL — ABNORMAL LOW (ref 30.0–36.0)
MCV: 87 fL (ref 80.0–100.0)
Monocytes Absolute: 0.7 10*3/uL (ref 0.1–1.0)
Monocytes Relative: 9 %
Neutro Abs: 6.5 10*3/uL (ref 1.7–7.7)
Neutrophils Relative %: 85 %
Platelets: 296 10*3/uL (ref 150–400)
RBC: 3.84 MIL/uL — ABNORMAL LOW (ref 3.87–5.11)
RDW: 15.7 % — ABNORMAL HIGH (ref 11.5–15.5)
WBC: 7.8 10*3/uL (ref 4.0–10.5)
nRBC: 0 % (ref 0.0–0.2)

## 2019-01-10 LAB — TROPONIN I (HIGH SENSITIVITY)
Troponin I (High Sensitivity): 16 ng/L (ref <18)
Troponin I (High Sensitivity): 17 ng/L (ref <18)

## 2019-01-10 MED ORDER — SODIUM CHLORIDE 0.9 % IV SOLN
2.0000 g | INTRAVENOUS | Status: DC
  Administered 2019-01-10: 22:00:00 2 g via INTRAVENOUS
  Filled 2019-01-10: qty 20

## 2019-01-10 MED ORDER — ENOXAPARIN SODIUM 40 MG/0.4ML ~~LOC~~ SOLN
40.0000 mg | SUBCUTANEOUS | Status: DC
  Administered 2019-01-10: 40 mg via SUBCUTANEOUS
  Filled 2019-01-10: qty 0.4

## 2019-01-10 NOTE — ED Triage Notes (Signed)
Per EMS report, patient's son noticed on his visits that patient appears weaker and has weeping edema to left leg. Patient possibly had a fall last week and c/o left rib/flank pain. Patient is on 2L O2 via [place] at home for COPD and CHF.

## 2019-01-10 NOTE — ED Notes (Signed)
Patient taken to CT scan.

## 2019-01-10 NOTE — ED Provider Notes (Signed)
5:07 PM Assumed care for off going team.   Blood pressure 126/63, pulse 79, temperature 98.5 F (36.9 C), temperature source Oral, resp. rate 18, SpO2 95 %.  See their HPI for full report but in brief   COPD, CHF on 2L, son brought her here, worried about increasing weakness. Sore to the left leg/swollen, and had a fall in the last week with rib pain. Xray, DVT US, CT chest/CT head. If negative workup consider keflex for cellulitis.   UA without evidence of UTI.  CO2 is elevated but that appears chronic in nature.  BUN is also elevated slightly more than 11 months ago but her kidney function is normal.  Troponin was 16.  Hemoglobin was 9.4 which is down from 12.9.  Will do rectal exam to further evaluate.  Initial troponin is 16  CT head and neck are negative except for concern for hyperparathyroidism which patient follow-up with outpatient.  X-ray no evidence of fracture.  DVT ultrasound was negative.  Patient is rectal exam with out any evidence of melena.  May be weakly Hemoccult positive.  Attempted to ambulate patient but he is unable to stand up.  Will discuss with the hospital team for admission for the cellulitis on her right leg as well as elevated BUN and drop in her hemoglobin.          Vanessa Makanda, MD 01/10/19 2030

## 2019-01-10 NOTE — H&P (Addendum)
History and Physical    Brenda Higgins OXB:[id number] DOB: August 27, 1935 DOA: 01/10/2019  PCP: Juel Burrow  Patient coming from: Home, lives with son  I have personally briefly reviewed patient's old medical records in [facility]  Chief Complaint: Weakness and increased leg pain  HPI: Brenda Higgins is a 83 y.o. female with medical history significant of diastolic congestive heart failure, hypertension, COPD with chronic hypoxemia requiring 2 L who presents for concerns of increased weakness and leg pain.  Patient is unable to provide much history as she is hard of hearing and does not have dentures.  History was obtained from her son over the phone.  He reports that for the past week he has noticed that she is gotten progressively more weak and has been having weeping of fluid on both lower extremity.  Normally at baseline she is able to do normal ADLs on her own.  However, recently she has been more confused and has had decreased appetite in the past 2 days. Has trouble with swallowing.  He reports that she also has not had a bowel movement for 4 to 6 days.  Denies any fever, nausea vomiting or diarrhea.  Denies any chest pain or shortness of breath. Patient recently saw her primary physician on 11/11 for increased urinary frequency and had negative urinalysis. Son was told to discontinued her 20mg  Lasix which he did for 3 days but resumed after she started to have weeping of fluids from her lower extremities.   ED Course: She was afebrile and normotensive on her home 2L of O2 via nasal cannula. CBC showed no leukocytosis but had anemia of 9.4 which was normal a year ago. Rectal exam performed by EDP showed only brown stool and negative occult test. BMP shows glucose of 125 and normal creatinine of 0.54. BNP of 384. Troponin of 16 and 17. Negative UA.  CT chest shows diffusely demineralized appearance of bones and ribs that could be seen with hyperparathyroidism. There is patchy opacities  concerning for pneumonia or aspiration.  Left leg tibia/Fibular X-ray shows benign appearing sclerotic lesion in the lateral tibial plateau. Negative DVT ultrasound of the left leg.   Patient was started by EDP on IV Rocephin for suspected cellulitis.   Review of Systems: Unable to obtain since pt is hard of hearing and could not understand questions  Past Medical History:  Diagnosis Date   Chronic respiratory failure (HCC)    COPD (chronic obstructive pulmonary disease) (HCC)    Hyperlipidemia     Past Surgical History:  Procedure Laterality Date   HIP SURGERY Right    INTRAMEDULLARY (IM) NAIL INTERTROCHANTERIC Left 09/05/2014   Procedure: INTRAMEDULLARY (IM) NAIL INTERTROCHANTRIC;  Surgeon: Kennedy Bucker, MD;  Location: [facility] ORS;  Service: Orthopedics;  Laterality: Left;     reports that she has been smoking cigarettes. She has been smoking about 0.50 packs per day. She has never used smokeless tobacco. She reports that she does not drink alcohol or use drugs.  Allergies  Allergen Reactions   Levofloxacin Shortness Of Breath and Rash   Amoxicillin-Pot Clavulanate Diarrhea    Family History  Problem Relation Age of Onset   Coronary artery disease Father        And mother     Prior to Admission medications   Medication Sig Start Date End Date Taking? Authorizing Provider  albuterol (PROVENTIL HFA) 108 (90 Base) MCG/ACT inhaler Inhale into the lungs.   Yes [provider]  amLODipine (NORVASC) 5  MG tablet Take 5 mg by mouth daily.   Yes [provider]  ANORO ELLIPTA 62.5-25 MCG/INH AEPB INHALE 1 INHALATION INTO THE LUNGS ONCE DAILY. DO NOT USE WITH SPIRIVA OR ADVAIR 05/06/17  Yes [provider]  aspirin EC 81 MG tablet Take 81 mg by mouth daily.   Yes [provider]  furosemide (LASIX) 20 MG tablet Take 1 tablet (20 mg total) by mouth daily. 09/08/14  Yes Sudini, Wardell HeathSrikar, MD  hydrochlorothiazide (MICROZIDE) 12.5 MG capsule Take 12.5  mg by mouth daily.  02/24/17  Yes [provider]  ipratropium-albuterol (DUONEB) 0.5-2.5 (3) MG/3ML SOLN Take 3 mLs by nebulization every 6 (six) hours. 09/08/14  Yes Sudini, Wardell HeathSrikar, MD  lovastatin (MEVACOR) 40 MG tablet Take 40 mg by mouth daily.   Yes [provider]    Physical Exam: Vitals:   01/10/19 1822 01/10/19 2130 01/10/19 2200 01/10/19 2225  BP:  123/70 125/65 (!) 113/59  Pulse: 84 84 78 79  Resp:  19 14 15   Temp:    98.3 F (36.8 C)  TempSrc:    Oral  SpO2: 90% 100% 96% 97%    Constitutional: NAD, calm, comfortable, thin,frail, non-toxic  appearing elderly female sitting up in bed  Vitals:   01/10/19 1822 01/10/19 2130 01/10/19 2200 01/10/19 2225  BP:  123/70 125/65 (!) 113/59  Pulse: 84 84 78 79  Resp:  19 14 15   Temp:    98.3 F (36.8 C)  TempSrc:    Oral  SpO2: 90% 100% 96% 97%   Eyes: PERRL, lids and conjunctivae normal ENMT: Mucous membranes are moist. Posterior pharynx clear of any exudate or lesions.No dentition.  Neck: normal, supple, no masses Respiratory: clear to auscultation bilaterally, no wheezing, no crackles. Normal respiratory effort on 2L via [place]. No accessory muscle use.  Cardiovascular: Regular rate and rhythm,3/6 systolic murmur. No extremity edema. 2+ pedal pulses.  Abdomen: no tenderness, no masses palpated.  Bowel sounds positive.  Musculoskeletal: no clubbing / cyanosis. No joint deformity upper and lower extremities. no contractures. Normal muscle tone. There is a slight increase edema to the left lateral lower extremity proximal to lateral malleolus with increase erythema but no increase warmth to touch. No weeping of fluids noted to bilateral lower extremity. Skin: small superficial abrasion on left patella  Neurologic: CN 2-12 grossly intact. Sensation intact. Strength 4/5 of the lower extremity. Psychiatric: Normal judgment and insight. Alert and oriented x 3. Normal mood.     Labs on Admission: I have personally  reviewed following labs and imaging studies  CBC: Recent Labs  Lab 01/10/19 1447  WBC 7.8  NEUTROABS 6.5  HGB 9.4*  HCT 33.4*  MCV 87.0  PLT 296   Basic Metabolic Panel: Recent Labs  Lab 01/10/19 1447  NA 139  K 3.6  CL 86*  CO2 38*  GLUCOSE 125*  BUN 36*  CREATININE 0.54  CALCIUM 8.6*   GFR: CrCl cannot be calculated (Unknown ideal weight.). Liver Function Tests: No results for input(s): AST, ALT, ALKPHOS, BILITOT, PROT, ALBUMIN in the last 168 hours. No results for input(s): LIPASE, AMYLASE in the last 168 hours. No results for input(s): AMMONIA in the last 168 hours. Coagulation Profile: No results for input(s): INR, PROTIME in the last 168 hours. Cardiac Enzymes: No results for input(s): CKTOTAL, CKMB, CKMBINDEX, TROPONINI in the last 168 hours. BNP (last 3 results) No results for input(s): PROBNP in the last 8760 hours. HbA1C: No results for input(s): HGBA1C in the  last 72 hours. CBG: No results for input(s): GLUCAP in the last 168 hours. Lipid Profile: No results for input(s): CHOL, HDL, LDLCALC, TRIG, CHOLHDL, LDLDIRECT in the last 72 hours. Thyroid Function Tests: No results for input(s): TSH, T4TOTAL, FREET4, T3FREE, THYROIDAB in the last 72 hours. Anemia Panel: No results for input(s): VITAMINB12, FOLATE, FERRITIN, TIBC, IRON, RETICCTPCT in the last 72 hours. Urine analysis:    Component Value Date/Time   COLORURINE YELLOW (A) 01/10/2019 1514   APPEARANCEUR CLEAR (A) 01/10/2019 1514   APPEARANCEUR CLOUDY 08/20/2013 0848   LABSPEC 1.011 01/10/2019 1514   LABSPEC 1.010 08/20/2013 0848   PHURINE 6.0 01/10/2019 1514   GLUCOSEU NEGATIVE 01/10/2019 1514   GLUCOSEU NEGATIVE 08/20/2013 0848   HGBUR NEGATIVE 01/10/2019 1514   BILIRUBINUR NEGATIVE 01/10/2019 1514   BILIRUBINUR NEGATIVE 08/20/2013 0848   KETONESUR NEGATIVE 01/10/2019 1514   PROTEINUR NEGATIVE 01/10/2019 1514   NITRITE NEGATIVE 01/10/2019 1514   LEUKOCYTESUR NEGATIVE 01/10/2019 1514    LEUKOCYTESUR 3+ 08/20/2013 0848    Radiological Exams on Admission: Dg Tibia/fibula Left  Result Date: 01/10/2019 CLINICAL DATA:  Fall, weeping edema to the left leg EXAM: LEFT TIBIA AND FIBULA - 2 VIEW COMPARISON:  None. FINDINGS: The osseous structures appear diffusely demineralized which may limit detection of small or nondisplaced fractures. No acute bony abnormality. Specifically, no fracture, subluxation, or dislocation. Fairly well-defined sclerotic/lucent lesion in the lateral tibial plateau with a relatively narrow zone of transition. Diffuse edematous soft tissue swelling of the leg. Vascular calcium noted posteriorly. Mild arthrosis in the knee and ankle. IMPRESSION: No acute osseous abnormality. Benign appearing sclerotic/lucent lesion in the lateral tibial plateau. Mild degenerative changes of the knee and ankle. Diffuse lower leg edema. Electronically Signed   By: Kreg ShropshirePrice  DeHay M.D.   On: 01/10/2019 15:23   Ct Head Wo Contrast  Result Date: 01/10/2019 CLINICAL DATA:  Fall, weakness EXAM: CT HEAD WITHOUT CONTRAST TECHNIQUE: Contiguous axial images were obtained from the base of the skull through the vertex without intravenous contrast. COMPARISON:  None. FINDINGS: Brain: Initial imaging resulted in motion artifact at the skull base. Images were reacquired. No evidence of acute infarction, hemorrhage, hydrocephalus, extra-axial collection or mass lesion/mass effect. Symmetric prominence of the ventricles, cisterns and sulci compatible with parenchymal volume loss. Patchy areas of white matter hypoattenuation are most compatible with chronic microvascular angiopathy. Vascular: Atherosclerotic calcification of the carotid siphons. No hyperdense vessel. Skull: Diffuse bony demineralization. There is loss of the inner and outer table definition with spotty demineralization compatible with a salt and pepper sign/pepper pot skull. No calvarial fracture. No scalp swelling or edema. Sinuses/Orbits:  Paranasal sinuses and mastoid air cells are predominantly clear. Included orbital structures are unremarkable. Other: None IMPRESSION: 1. No acute intracranial abnormality. 2. Chronic microvascular angiopathy and parenchymal volume loss. 3. Spotty demineralization of the skull has an appearance suggestive of hyperparathyroidism. Electronically Signed   By: Kreg ShropshirePrice  DeHay M.D.   On: 01/10/2019 16:21   Ct Chest Wo Contrast  Result Date: 01/10/2019 CLINICAL DATA:  Fall, evaluate for rib fractures EXAM: CT CHEST WITHOUT CONTRAST TECHNIQUE: Multidetector CT imaging of the chest was performed following the standard protocol without IV contrast. COMPARISON:  CT chest 04/23/2012 FINDINGS: Cardiovascular: Cardiomegaly. Extensive three-vessel coronary artery disease is noted. Calcifications present on the aortic leaflets. The thoracic aorta is heavily calcified. Extensive calcifications seen throughout the proximal great vessels. Central pulmonary arteries are enlarged. Luminal evaluation precluded in the absence of contrast media. Mediastinum/Nodes: No enlarged mediastinal or axillary adenopathy.  Diminutive thyroid gland. No acute abnormality of the esophagus. Coronal narrowing of the trachea with scattered secretions in the central airways. Lungs/Pleura: Extensive severe centrilobular and paraseptal emphysematous changes are noted with more hazy opacity in the lungs with some fissural and septal thickening. More patchy opacities present in the right lower lobe with dependent atelectasis and a small right trace left pleural effusion. Upper Abdomen: No acute abnormalities present in the visualized portions of the upper abdomen. Musculoskeletal: The osseous structures appear diffusely demineralized which may limit detection of small or nondisplaced fractures. In comparison to prior studies there is been significant increase in the degree of kyphotic curvature of the upper thoracic spine most pronounced around the T6-T7  levels. There has been interval development of compression deformities at T8 (20% height loss), T9 (60% height loss), and T10 (50% height loss). There is S-shaped scoliotic curvature of the thoracic and lumbar spine, dextrocurvature apex at T5 and levocurvature apex at T12. Increased AP diameter of the chest. Several remote posttraumatic deformities of the ribs are noted including the right second, fourth fifth and sixth ribs laterally. Several areas of thinning and sclerotic change of the ribs concerning for rib notching are noted involving the right third through sixth rib posteriorly and the left third rib posteriorly. IMPRESSION: 1. Diffusely demineralized appearance of the bones may limit detection of subtle, nondisplaced fractures. 2. New compression deformities at T8, T9 and T10. 3. Progressive exaggeration of the thoracic kyphosis a background of scoliotic curvature and increased AP diameter of the chest. 4. Several areas of thinning and sclerotic change of the ribs concerning for rib notching are noted involving the right third through sixth rib posteriorly and the left third rib posteriorly. Such findings are nonspecific but could be seen with a resorptive process including hyperparathyroidism. 5. Cardiomegaly with extensive three-vessel coronary artery disease. 6. Central pulmonary arteries are enlarged, which can be seen with pulmonary arterial hypertension. 7. Extensive severe Emphysema (ICD10-J43.9). 8. More patchy opacities in the right lower lobe with dependent atelectasis and a small right trace left pleural effusion, concerning for superimposed pneumonia or aspiration particularly given the scattered secretions within the airways. 9. Coronal narrowing of the trachea, a saber sheath deformity which is typically considered pathognomonic of COPD. 10. Aortic Atherosclerosis (ICD10-I70.0). Electronically Signed   By: Lovena Le M.D.   On: 01/10/2019 16:18   US Venous Img Lower Unilateral  Left  Result Date: 01/10/2019 CLINICAL DATA:  [place], weeping edema EXAM: LEFT LOWER EXTREMITY VENOUS DOPPLER ULTRASOUND TECHNIQUE: Gray-scale sonography with compression, as well as color and duplex ultrasound, were performed to evaluate the deep venous system from the level of the common femoral vein through the popliteal and proximal calf veins. COMPARISON:  09/04/2014 FINDINGS: Normal compressibility of the common femoral, superficial femoral, and popliteal veins, as well as the proximal calf veins. No filling defects to suggest DVT on grayscale or color Doppler imaging. Doppler waveforms show normal direction of venous flow, normal respiratory phasicity and response to augmentation. Subcutaneous edema in the calf. Survey views of the contralateral common femoral vein are unremarkable. IMPRESSION: No femoropopliteal and no calf DVT in the visualized calf veins. If clinical symptoms are inconsistent or if there are persistent or worsening symptoms, further imaging (possibly involving the iliac veins) may be warranted. Electronically Signed   By: Lucrezia Europe M.D.   On: 01/10/2019 15:50    EKG: Independently reviewed.   Assessment/Plan  Weakness in the setting of LE pain and edema - pt did not  have significant edema here but son noted weeping of fluid at home and her Lasix was discontinued for a few days by PCP for increase urinary frequency. She was started on IV Rocephin in ED for cellulitis but do not suspect she has this based on my exam - will continue Lasix - PT   Anemia - this could be the contributory to her weakness - hemoglobin of 9.4 on admission - Negative FOBT done by ED physician - obtain Vitamin B12, Folic and iron panel   Demineralized bone density - incidental finding on CT chest suspicious for hyperparathyroidism - Calcium is low on admission - will check PTH  Dysphagia - son notes increase difficulty swallowing at home - Speech eval  Constipation - last BM reportedly  was 4-6 days ago - start bowel regimen with Miralax and docusate   COPD -Stable on home 2L - Continue home bronchodilator  Hypertension - continue amlodipine, HCTZ   Hyperlipidemia  -continue statin   DVT prophylaxis:Lovenox Code Status:DNR Family Communication: Plan discussed with patient at bedside and son updated over the phone disposition Plan: Home with at 1 midnight stays  Consults called:  Admission status: Observation    Rami Waddle T Freddie Dymek DO [facility]   If 7PM-7AM, please contact night-coverage [email or web address] Password [email or web address]  01/10/2019, 11:52 PM

## 2019-01-10 NOTE — ED Notes (Signed)
Patient is drinking water with a straw with no difficulty.

## 2019-01-10 NOTE — ED Provider Notes (Signed)
[facility] Emergency Department Provider Note  ____________________________________________   First MD Initiated Contact with Patient 01/10/19 1344     (approximate)  I have reviewed the triage vital signs and the nursing notes.  History  Chief Complaint Weakness    HPI Brenda Higgins is a 83 y.o. female with history of CHF, COPD on 2 L at baseline who presents to the emergency department with multiple complaints.   History primarily obtain from EMS, as patient is an extremely poor historian and is unclear about why she is here.   Per EMS report, patient's son noticed that the patient appear weaker than normal. He noted some BLE edema, left > right, and a weeping wound to the left anterior shin. Patient cannot provide a timeline for this.   Patient also states she has fallen recently, though unclear when this may have happened. She says she hit her chest against a door and has resultant bilateral rib pain.    Past Medical Hx Past Medical History:  Diagnosis Date  . Chronic respiratory failure (HCC)   . COPD (chronic obstructive pulmonary disease) (HCC)   . Hyperlipidemia     Problem List Patient Active Problem List   Diagnosis Date Noted  . COPD exacerbation (HCC) 09/08/2014  . Acute on chronic respiratory failure (HCC) 09/08/2014  . Acute on chronic diastolic CHF (congestive heart failure), NYHA class 1 (HCC) 09/08/2014  . Diastolic CHF (HCC) 09/08/2014  . Hip fracture (HCC) 09/04/2014    Past Surgical Hx Past Surgical History:  Procedure Laterality Date  . HIP SURGERY Right   . INTRAMEDULLARY (IM) NAIL INTERTROCHANTERIC Left 09/05/2014   Procedure: INTRAMEDULLARY (IM) NAIL INTERTROCHANTRIC;  Surgeon: Kennedy Bucker, MD;  Location: [facility] ORS;  Service: Orthopedics;  Laterality: Left;    Medications Prior to Admission medications   Medication Sig Start Date End Date Taking? Authorizing Provider  albuterol (PROVENTIL HFA) 108 (90 Base)  MCG/ACT inhaler Inhale into the lungs.    [provider]  amLODipine (NORVASC) 5 MG tablet Take 5 mg by mouth daily.    [provider]  ANORO ELLIPTA 62.5-25 MCG/INH AEPB INHALE 1 INHALATION INTO THE LUNGS ONCE DAILY. DO NOT USE WITH SPIRIVA OR ADVAIR 05/06/17   [provider]  aspirin EC 81 MG tablet Take 81 mg by mouth daily.    [provider]  enoxaparin (LOVENOX) 40 MG/0.4ML injection Inject 0.4 mLs (40 mg total) into the skin daily. 09/08/14 05/19/17  Milagros Loll, MD  famotidine (PEPCID) 20 MG tablet Take 1 tablet (20 mg total) by mouth daily. 01/25/18 01/25/19  Nita Sickle, MD  furosemide (LASIX) 20 MG tablet Take 1 tablet (20 mg total) by mouth daily. 09/08/14   Milagros Loll, MD  hydrochlorothiazide (MICROZIDE) 12.5 MG capsule  02/24/17   [provider]  ipratropium-albuterol (DUONEB) 0.5-2.5 (3) MG/3ML SOLN Take 3 mLs by nebulization every 6 (six) hours. 09/08/14   Milagros Loll, MD  lovastatin (MEVACOR) 40 MG tablet Take 40 mg by mouth daily.    [provider]  oxyCODONE-acetaminophen (PERCOCET) 5-325 MG tablet Take 1 tablet by mouth every 6 (six) hours as needed for severe pain. 05/19/17   Rockne Menghini, MD  tiotropium (SPIRIVA) 18 MCG inhalation capsule Place 18 mcg into inhaler and inhale daily.    [provider]  tiotropium (SPIRIVA) 18 MCG inhalation capsule Place 1 capsule (18 mcg total) into inhaler and inhale daily. 09/08/14   Milagros Loll, MD    Allergies Levofloxacin and  Amoxicillin-pot clavulanate  Family Hx Family History  Problem Relation Age of Onset  . Coronary artery disease Father        And mother    Social Hx Social History   Tobacco Use  . Smoking status: Current Every Day Smoker    Packs/day: 0.50    Types: Cigarettes  . Smokeless tobacco: Never Used  Substance Use Topics  . Alcohol use: No  . Drug use: No     Review of Systems  Constitutional: Negative for fever,  chills. + fatigue Eyes: Negative for visual changes. ENT: Negative for sore throat. Cardiovascular: Negative for chest pain. Respiratory: Negative for shortness of breath. Gastrointestinal: Negative for nausea, vomiting.  Genitourinary: Negative for dysuria. Musculoskeletal: + for leg swelling. Skin: + wound to left shin Neurological: Negative for for headaches.   Physical Exam  Vital Signs: ED Triage Vitals [01/10/19 1349]  Enc Vitals Group     BP (!) 117/58     Pulse Rate 77     Resp 16     Temp 98.5 F (36.9 C)     Temp Source Oral     SpO2 95 %     Weight      Height      Head Circumference      Peak Flow      Pain Score      Pain Loc      Pain Edu?      Excl. in [place]?     Constitutional: Awake and alert. Very hard of hearing. Thin and cachectic.  Head: Normocephalic. Atraumatic. Eyes: Conjunctivae clear. Sclera anicteric. Nose: No congestion. No rhinorrhea. Mouth/Throat: Wearing mask.  Neck: No stridor.   Cardiovascular: Normal rate, regular rhythm. Extremities well perfused. 2+ symmetric DP and PT pulses.  Respiratory: Normal respiratory effort.  Lungs clear bilaterally, no significant wheezing. On baseline 2 L [place]. Chest: Anterior chest wall TTP, no step offs or crepitance appreciated.  Gastrointestinal: Soft. Non-tender. Non-distended.  Musculoskeletal: Pitting edema to BLE to proximal tibia, L > R. Neurologic:  Normal speech and language. No gross focal neurologic deficits are appreciated.  Skin: ~4 x 2 cm area of erythema to left anterior shin with some associated weeping of clear lymphedema. No purulence. No fluctuance.  Psychiatric: Mood and affect are appropriate for situation.  EKG  Personally reviewed.   Rate: 84 Rhythm: sinus Axis: normal Intervals: WNL No acute ischemic changes No STEMI    Radiology  Pending at sign out.    Procedures  Procedure(s) performed (including critical care):  Procedures   Initial Impression / Assessment  and Plan / ED Course  83 y.o. female who presents to the ED for multiple complaints -  generalized decline, recent fall, leg swelling, and area of cellulitis to the left anterior shin.  With regards to her generalized weakness, will obtain labs, imaging to evaluate for anemia, electrolyte abnormality, HF exacerbation, UTI or other infection.   With regards to her leg swelling, will evaluate for HF exacerbation, XR given fall, [place] to r/o clot.   With regards to recent fall, will obtain CT head and chest imaging.   Patient signed out to oncoming provider pending results. If deemed appropriate for discharge, would start on Keflex for area of cellulitis to the shin.     Final Clinical Impression(s) / ED Diagnosis  Final diagnoses:  Weakness  Leg swelling  Cellulitis of left lower extremity  Fall, initial encounter       Note:  This document  was prepared using Conservation officer, historic buildings and may include unintentional dictation errors.   Miguel Aschoff., MD 01/10/19 [phone]

## 2019-01-10 NOTE — ED Notes (Signed)
Patient failed to stand without assistance. Patient was assisted x2 to get back on the stretcher. [place] is in place.

## 2019-01-10 NOTE — ED Notes (Signed)
Report given to Tameka RN 

## 2019-01-10 NOTE — ED Notes (Signed)
Ultrasound at bedside

## 2019-01-11 DIAGNOSIS — Z66 Do not resuscitate: Secondary | ICD-10-CM | POA: Diagnosis present

## 2019-01-11 DIAGNOSIS — I081 Rheumatic disorders of both mitral and tricuspid valves: Secondary | ICD-10-CM | POA: Diagnosis present

## 2019-01-11 DIAGNOSIS — J9622 Acute and chronic respiratory failure with hypercapnia: Secondary | ICD-10-CM

## 2019-01-11 DIAGNOSIS — J441 Chronic obstructive pulmonary disease with (acute) exacerbation: Principal | ICD-10-CM

## 2019-01-11 DIAGNOSIS — N1832 Chronic kidney disease, stage 3b: Secondary | ICD-10-CM | POA: Diagnosis not present

## 2019-01-11 DIAGNOSIS — I5033 Acute on chronic diastolic (congestive) heart failure: Secondary | ICD-10-CM | POA: Diagnosis present

## 2019-01-11 DIAGNOSIS — Z880 Allergy status to penicillin: Secondary | ICD-10-CM | POA: Diagnosis not present

## 2019-01-11 DIAGNOSIS — I110 Hypertensive heart disease with heart failure: Secondary | ICD-10-CM | POA: Diagnosis present

## 2019-01-11 DIAGNOSIS — Z681 Body mass index (BMI) 19 or less, adult: Secondary | ICD-10-CM | POA: Diagnosis not present

## 2019-01-11 DIAGNOSIS — J9621 Acute and chronic respiratory failure with hypoxia: Secondary | ICD-10-CM | POA: Diagnosis present

## 2019-01-11 DIAGNOSIS — R627 Adult failure to thrive: Secondary | ICD-10-CM | POA: Diagnosis present

## 2019-01-11 DIAGNOSIS — L03116 Cellulitis of left lower limb: Secondary | ICD-10-CM | POA: Diagnosis present

## 2019-01-11 DIAGNOSIS — Z881 Allergy status to other antibiotic agents status: Secondary | ICD-10-CM | POA: Diagnosis not present

## 2019-01-11 DIAGNOSIS — R1310 Dysphagia, unspecified: Secondary | ICD-10-CM | POA: Diagnosis present

## 2019-01-11 DIAGNOSIS — Z7982 Long term (current) use of aspirin: Secondary | ICD-10-CM | POA: Diagnosis not present

## 2019-01-11 DIAGNOSIS — F17210 Nicotine dependence, cigarettes, uncomplicated: Secondary | ICD-10-CM | POA: Diagnosis present

## 2019-01-11 DIAGNOSIS — E785 Hyperlipidemia, unspecified: Secondary | ICD-10-CM | POA: Diagnosis present

## 2019-01-11 DIAGNOSIS — Z7951 Long term (current) use of inhaled steroids: Secondary | ICD-10-CM | POA: Diagnosis not present

## 2019-01-11 DIAGNOSIS — K5900 Constipation, unspecified: Secondary | ICD-10-CM | POA: Diagnosis present

## 2019-01-11 DIAGNOSIS — R531 Weakness: Secondary | ICD-10-CM | POA: Diagnosis present

## 2019-01-11 DIAGNOSIS — Z20828 Contact with and (suspected) exposure to other viral communicable diseases: Secondary | ICD-10-CM | POA: Diagnosis present

## 2019-01-11 DIAGNOSIS — Z8249 Family history of ischemic heart disease and other diseases of the circulatory system: Secondary | ICD-10-CM | POA: Diagnosis not present

## 2019-01-11 DIAGNOSIS — Z79899 Other long term (current) drug therapy: Secondary | ICD-10-CM | POA: Diagnosis not present

## 2019-01-11 DIAGNOSIS — E43 Unspecified severe protein-calorie malnutrition: Secondary | ICD-10-CM | POA: Diagnosis present

## 2019-01-11 DIAGNOSIS — D649 Anemia, unspecified: Secondary | ICD-10-CM | POA: Diagnosis present

## 2019-01-11 LAB — BLOOD GAS, ARTERIAL
Acid-Base Excess: 26.3 mmol/L — ABNORMAL HIGH (ref 0.0–2.0)
Acid-Base Excess: 21.8 mmol/L — ABNORMAL HIGH (ref 0.0–2.0)
Allens test (pass/fail): POSITIVE — AB
Bicarbonate: 54.3 mmol/L — ABNORMAL HIGH (ref 20.0–28.0)
Bicarbonate: 52.6 mmol/L — ABNORMAL HIGH (ref 20.0–28.0)
FIO2: 1
FIO2: 1
O2 Saturation: 83.6 %
O2 Saturation: 99.8 %
Patient temperature: 37
Patient temperature: 37
pCO2 arterial: 68 mmHg (ref 32.0–48.0)
pCO2 arterial: 91 mmHg (ref 32.0–48.0)
pH, Arterial: 7.51 — ABNORMAL HIGH (ref 7.350–7.450)
pH, Arterial: 7.37 (ref 7.350–7.450)
pO2, Arterial: 43 mmHg — ABNORMAL LOW (ref 83.0–108.0)
pO2, Arterial: 228 mmHg — ABNORMAL HIGH (ref 83.0–108.0)

## 2019-01-11 LAB — TSH: TSH: 0.698 u[IU]/mL (ref 0.350–4.500)

## 2019-01-11 LAB — CBC
HCT: 30.1 % — ABNORMAL LOW (ref 36.0–46.0)
Hemoglobin: 8.7 g/dL — ABNORMAL LOW (ref 12.0–15.0)
MCH: 24.2 pg — ABNORMAL LOW (ref 26.0–34.0)
MCHC: 28.9 g/dL — ABNORMAL LOW (ref 30.0–36.0)
MCV: 83.6 fL (ref 80.0–100.0)
Platelets: 279 10*3/uL (ref 150–400)
RBC: 3.6 MIL/uL — ABNORMAL LOW (ref 3.87–5.11)
RDW: 15.9 % — ABNORMAL HIGH (ref 11.5–15.5)
WBC: 7.2 10*3/uL (ref 4.0–10.5)
nRBC: 0 % (ref 0.0–0.2)

## 2019-01-11 LAB — PROCALCITONIN: Procalcitonin: <0.1 ng/mL

## 2019-01-11 LAB — BASIC METABOLIC PANEL
Anion gap: 11 (ref 5–15)
BUN: 27 mg/dL — ABNORMAL HIGH (ref 8–23)
CO2: 43 mmol/L — ABNORMAL HIGH (ref 22–32)
Calcium: 8.3 mg/dL — ABNORMAL LOW (ref 8.9–10.3)
Chloride: 89 mmol/L — ABNORMAL LOW (ref 98–111)
Creatinine, Ser: 0.43 mg/dL — ABNORMAL LOW (ref 0.44–1.00)
GFR calc Af Amer: >60 mL/min (ref >60)
GFR calc non Af Amer: >60 mL/min (ref >60)
Glucose, Bld: 91 mg/dL (ref 70–99)
Potassium: 3.5 mmol/L (ref 3.5–5.1)
Sodium: 143 mmol/L (ref 135–145)

## 2019-01-11 LAB — IRON AND TIBC
Iron: 19 ug/dL — ABNORMAL LOW (ref 28–170)
Saturation Ratios: 4 % — ABNORMAL LOW (ref 10.4–31.8)
TIBC: 445 ug/dL (ref 250–450)
UIBC: 427 ug/dL

## 2019-01-11 LAB — FOLATE: Folate: 14.4 ng/mL (ref >5.9)

## 2019-01-11 LAB — FERRITIN: Ferritin: 6 ng/mL — ABNORMAL LOW (ref 11–307)

## 2019-01-11 LAB — VITAMIN B12: Vitamin B-12: 219 pg/mL (ref 180–914)

## 2019-01-11 LAB — SARS CORONAVIRUS 2 (TAT 6-24 HRS): SARS Coronavirus 2: NEGATIVE

## 2019-01-11 MED ORDER — FUROSEMIDE 10 MG/ML IJ SOLN
20.0000 mg | Freq: Two times a day (BID) | INTRAMUSCULAR | Status: DC
  Administered 2019-01-11 – 2019-01-15 (×8): 20 mg via INTRAVENOUS
  Filled 2019-01-11 (×8): qty 4

## 2019-01-11 MED ORDER — IPRATROPIUM-ALBUTEROL 0.5-2.5 (3) MG/3ML IN SOLN: 3.0000 mL | Freq: Four times a day (QID) | RESPIRATORY_TRACT | Status: DC | PRN

## 2019-01-11 MED ORDER — DOCUSATE SODIUM 100 MG PO CAPS
100.0000 mg | ORAL_CAPSULE | Freq: Every day | ORAL | Status: DC
  Administered 2019-01-11 – 2019-01-15 (×5): 100 mg via ORAL
  Filled 2019-01-11 (×6): qty 1

## 2019-01-11 MED ORDER — HYDROCHLOROTHIAZIDE 12.5 MG PO CAPS
12.5000 mg | ORAL_CAPSULE | Freq: Every day | ORAL | Status: DC
  Filled 2019-01-11: qty 1

## 2019-01-11 MED ORDER — ENOXAPARIN SODIUM 30 MG/0.3ML ~~LOC~~ SOLN
30.0000 mg | SUBCUTANEOUS | Status: DC
  Administered 2019-01-11 – 2019-01-14 (×3): 30 mg via SUBCUTANEOUS
  Filled 2019-01-11 (×3): qty 0.3

## 2019-01-11 MED ORDER — POLYETHYLENE GLYCOL 3350 17 G PO PACK
17.0000 g | PACK | Freq: Every day | ORAL | Status: DC
  Administered 2019-01-11 – 2019-01-15 (×4): 17 g via ORAL
  Filled 2019-01-11 (×5): qty 1

## 2019-01-11 MED ORDER — FUROSEMIDE 10 MG/ML IJ SOLN
40.0000 mg | Freq: Once | INTRAMUSCULAR | Status: AC
  Administered 2019-01-11: 09:00:00 40 mg via INTRAVENOUS
  Filled 2019-01-11: qty 4

## 2019-01-11 MED ORDER — ACETAMINOPHEN 325 MG PO TABS
650.0000 mg | ORAL_TABLET | Freq: Four times a day (QID) | ORAL | Status: DC | PRN
  Administered 2019-01-11 – 2019-01-14 (×3): 650 mg via ORAL
  Filled 2019-01-11 (×3): qty 2

## 2019-01-11 MED ORDER — UMECLIDINIUM-VILANTEROL 62.5-25 MCG/INH IN AEPB
1.0000 | INHALATION_SPRAY | Freq: Every day | RESPIRATORY_TRACT | Status: DC
  Administered 2019-01-11 – 2019-01-15 (×3): 1 via RESPIRATORY_TRACT
  Filled 2019-01-11 (×2): qty 14

## 2019-01-11 MED ORDER — AMLODIPINE BESYLATE 5 MG PO TABS: 5.0000 mg | ORAL_TABLET | Freq: Every day | ORAL | Status: DC

## 2019-01-11 MED ORDER — ASPIRIN EC 81 MG PO TBEC
81.0000 mg | DELAYED_RELEASE_TABLET | Freq: Every day | ORAL | Status: DC
  Administered 2019-01-11 – 2019-01-15 (×5): 81 mg via ORAL
  Filled 2019-01-11 (×5): qty 1

## 2019-01-11 MED ORDER — PRAVASTATIN SODIUM 10 MG PO TABS
10.0000 mg | ORAL_TABLET | Freq: Every day | ORAL | Status: DC
  Administered 2019-01-14: 18:00:00 10 mg via ORAL
  Filled 2019-01-11 (×5): qty 1

## 2019-01-11 MED ORDER — FUROSEMIDE 40 MG PO TABS
20.0000 mg | ORAL_TABLET | Freq: Every day | ORAL | Status: DC
  Filled 2019-01-11: qty 1

## 2019-01-11 MED ORDER — ALPRAZOLAM 0.5 MG PO TABS
0.2500 mg | ORAL_TABLET | Freq: Four times a day (QID) | ORAL | Status: DC | PRN
  Administered 2019-01-13: 0.25 mg via ORAL
  Filled 2019-01-11: qty 1

## 2019-01-11 NOTE — Progress Notes (Signed)
Pt's oxygen is all over the map. 98% on 4L [place] while sleeping to 86% on 4L [place] while sleeping. Will monitor closely.  Also, patient confused, mostly deaf and mostly blind. Almost impossible to communicate with.

## 2019-01-11 NOTE — Progress Notes (Addendum)
PROGRESS NOTE  Brenda Higgins SJG:[id number] DOB: February 24, 1935 DOA: 01/10/2019 PCP: System, Pcp Not In  HPI/Recap of past 30 hours: 83 year old with past medical history of diastolic heart failure, hypertension and COPD on chronic 2 L nasal cannula admitted on 11/19 after being brought in by her son for increased confusion, weakness.  Apparently, patient has been going downhill for the past 6 months but more so in the past few days.  According to the patient's son, she had been urinating much more frequently so she was evaluated by her primary care doctor and found to have no evidence of urinary tract infection.  They recommended that she stop her Lasix.  Few days later, patient son states that her legs have become more swollen and having weeping edema.  In the emergency room, patient had to be afebrile and normotensive.  Initially she was on her oxygen of 2 L nasal cannula.  Hemoglobin noted anemia, but chronic.  Abnormal labs were noteworthy for a BNP of 384.  CT scan of chest noted diffusely demineralized appearance of bones and ribs that can be seen in hyperparathyroidism.  Patchy opacities were noted concerning for pneumonia or aspiration.  Patient was given a dose of IV Rocephin for suspected leg cellulitis, although the admitting physician did not think that she had that.  Following admission, patient had episodes of oxygen desaturation and an ABG noted marked hypercarbia and hypoxia.  However, respiratory suspected the sample may be incorrect and a repeat ABG noted hypoxia and hypercarbia, but compensated with a stable pH.  Patient seen this morning in the emergency room.  She was mostly somnolent.  Son at the bedside.  Assessment/Plan: Acute on chronic respiratory failure with hypercarbia and hypoxia from COPD exacerbation and acute on chronic diastolic heart failure: Suspect that patient easily gets hypercarbic.  While she is somewhat compensated, she at times has significant oxygen  desaturations.  Would prefer to keep her close to her baseline giving only oxygen for persistent episodes.  She is not apneic.  A big contributing factor is likely her heart failure.  Will recheck echocardiogram as the last one we have done in 2016.  Have started IV Lasix and monitoring strict input and output.  Given infiltrate seen on CT, checking procalcitonin level.  Addendum: Procalcitonin level normal.  No reason for antibiotics.  Failure to thrive: Nutrition to evaluate caloric intake.  Speech therapy to check swallow evaluation.  Demineralization: Seen on CT.  Patient does not have hypercalcemia.  PTH level pending.  Calcium level slightly low, so checking ionized calcium level.  TSH appears normal.  Code Status: DNR  Family Communication: Son at the bedside  Disposition Plan: Son is willing to let patient go to short-term skilled nursing, but would like to eventually get her home.   Consultants:  None  Procedures:  Echocardiogram ordered  Antimicrobials:  IV Rocephin x1 dose 11/19  DVT prophylaxis: Lovenox   Objective: Vitals:   01/11/19 0746 01/11/19 1547  BP: 123/65 (!) 124/51  Pulse: 77 71  Resp: 16 16  Temp:    SpO2: (!) 87% (!) 84%   No intake or output data in the 24 hours ending 01/11/19 1652 Filed Weights   01/11/19 1000  Weight: 36.3 kg   Body mass index is 19.29 kg/m.  Exam:   General: Somnolent, oriented x1  HEENT: Normocephalic and atraumatic, mucous membranes are dry  Neck: Supple, mild JVD  Cardiovascular: Regular rate and rhythm, frequent PACs  Respiratory: Full inspiratory effort,  decreased breath sounds at bases  Abdomen: Soft, nontender, nondistended, positive bowel sounds  Musculoskeletal: No clubbing or cyanosis, 1+ pitting edema from the knees down bilaterally  Skin: Chronic venous stasis  Psychiatry: Somnolent, does not appear to be acutely in psychosis   Data Reviewed: CBC: Recent Labs  Lab 01/10/19 1447  01/11/19 0521  WBC 7.8 7.2  NEUTROABS 6.5  --   HGB 9.4* 8.7*  HCT 33.4* 30.1*  MCV 87.0 83.6  PLT 296 841   Basic Metabolic Panel: Recent Labs  Lab 01/10/19 1447 01/11/19 0521  NA 139 143  K 3.6 3.5  CL 86* 89*  CO2 38* 43*  GLUCOSE 125* 91  BUN 36* 27*  CREATININE 0.54 0.43*  CALCIUM 8.6* 8.3*   GFR: Estimated Creatinine Clearance: 27.6 mL/min (A) (by C-G formula based on SCr of 0.43 mg/dL (L)). Liver Function Tests: No results for input(s): AST, ALT, ALKPHOS, BILITOT, PROT, ALBUMIN in the last 168 hours. No results for input(s): LIPASE, AMYLASE in the last 168 hours. No results for input(s): AMMONIA in the last 168 hours. Coagulation Profile: No results for input(s): INR, PROTIME in the last 168 hours. Cardiac Enzymes: No results for input(s): CKTOTAL, CKMB, CKMBINDEX, TROPONINI in the last 168 hours. BNP (last 3 results) No results for input(s): PROBNP in the last 8760 hours. HbA1C: No results for input(s): HGBA1C in the last 72 hours. CBG: No results for input(s): GLUCAP in the last 168 hours. Lipid Profile: No results for input(s): CHOL, HDL, LDLCALC, TRIG, CHOLHDL, LDLDIRECT in the last 72 hours. Thyroid Function Tests: Recent Labs    01/11/19 0521  TSH 0.698   Anemia Panel: Recent Labs    01/11/19 0521  VITAMINB12 219  FOLATE 14.4  FERRITIN 6*  TIBC 445  IRON 19*   Urine analysis:    Component Value Date/Time   COLORURINE YELLOW (A) 01/10/2019 1514   APPEARANCEUR CLEAR (A) 01/10/2019 1514   APPEARANCEUR CLOUDY 08/20/2013 0848   LABSPEC 1.011 01/10/2019 1514   LABSPEC 1.010 08/20/2013 0848   PHURINE 6.0 01/10/2019 1514   GLUCOSEU NEGATIVE 01/10/2019 1514   GLUCOSEU NEGATIVE 08/20/2013 0848   HGBUR NEGATIVE 01/10/2019 1514   BILIRUBINUR NEGATIVE 01/10/2019 1514   BILIRUBINUR NEGATIVE 08/20/2013 0848   KETONESUR NEGATIVE 01/10/2019 1514   PROTEINUR NEGATIVE 01/10/2019 1514   NITRITE NEGATIVE 01/10/2019 1514   LEUKOCYTESUR NEGATIVE  01/10/2019 1514   LEUKOCYTESUR 3+ 08/20/2013 0848   Sepsis Labs: @LABRCNTIP (procalcitonin:4,lacticidven:4)  ) Recent Results (from the past 240 hour(s))  SARS CORONAVIRUS 2 (TAT 6-24 HRS) Nasopharyngeal Nasopharyngeal Swab     Status: None   Collection Time: 01/10/19  9:38 PM   Specimen: Nasopharyngeal Swab  Result Value Ref Range Status   SARS Coronavirus 2 NEGATIVE NEGATIVE Final    Comment: (NOTE) SARS-CoV-2 target nucleic acids are NOT DETECTED. The SARS-CoV-2 RNA is generally detectable in upper and lower respiratory specimens during the acute phase of infection. Negative results do not preclude SARS-CoV-2 infection, do not rule out co-infections with other pathogens, and should not be used as the sole basis for treatment or other patient management decisions. Negative results must be combined with clinical observations, patient history, and epidemiological information. The expected result is Negative. Fact Sheet for Patients: SugarRoll.be Fact Sheet for Healthcare Providers: https://www.woods-mathews.com/ This test is not yet approved or cleared by the [place] FDA and  has been authorized for detection and/or diagnosis of SARS-CoV-2 by FDA under an Emergency Use Authorization (EUA). This EUA will remain  in  effect (meaning this test can be used) for the duration of the COVID-19 declaration under Section 56 4(b)(1) of the Act, 21 U.S.C. section 360bbb-3(b)(1), unless the authorization is terminated or revoked sooner. Performed at [facility], [address] [place], [place] [address]       Studies: No results found.  Scheduled Meds: . aspirin EC  81 mg Oral Daily  . docusate sodium  100 mg Oral Daily  . enoxaparin (LOVENOX) injection  30 mg Subcutaneous Q24H  . furosemide  20 mg Intravenous Q12H  . polyethylene glycol  17 g Oral Daily  . pravastatin  10 mg Oral q1800  . umeclidinium-vilanterol  1 puff  Inhalation Daily  Avva and wheezing is gone  Continuous Infusions:   LOS: 0 days     Hollice Espy, MD [facility]  To reach me or the doctor on call, go to: [email or web address] Password [facility]  01/11/2019, 4:52 PM

## 2019-01-11 NOTE — Progress Notes (Signed)
Report called to Jennifer, RN.

## 2019-01-11 NOTE — Progress Notes (Signed)
PHARMACIST - PHYSICIAN COMMUNICATION  CONCERNING:  Enoxaparin (Lovenox) for DVT Prophylaxis   RECOMMENDATION: Patient was prescribed enoxaprin 40mg  q24 hours for VTE prophylaxis.   Filed Weights   01/11/19 1000  Weight: 80 lb (36.3 kg)   Body mass index is 19.29 kg/m.  Estimated Creatinine Clearance: 27.6 mL/min (A) (by C-G formula based on SCr of 0.43 mg/dL (L)).  Patient is candidate for enoxaparin 30mg  every 24 hours based on CrCl <39ml/min and Weight less then 45kg   DESCRIPTION: Pharmacy has adjusted enoxaparin dose per [facility] policy.  Patient is now receiving enoxaparin 30mg  every 24 hours.  Pernell Dupre, PharmD, BCPS Clinical Pharmacist 01/11/2019 10:10 AM

## 2019-01-11 NOTE — Evaluation (Signed)
Physical Therapy Evaluation Patient Details Name: Brenda Higgins MRN: [id number] DOB: [birth year] Today's Date: 01/11/2019   History of Present Illness  Pt is 83 y.o. female with medical history significant of diastolic congestive heart failure, hypertension, COPD with chronic hypoxemia requiring 2 L who presents for concerns of increased weakness and leg pain.    Clinical Impression  The patient up in bed, alert, extremely HOH, needed PT to yell into ear to be heard. PT spoke with family on phone prior to session for PLOF; pt was able to ambulate with RW for household distances and perform ADLs with no assistance. Lives with her son who goes to work during the day (checks on her at lunch). He reported in the last month the patient has had significantly more difficulty performing mobility and has had two falls in the last 6 months.  The patient was able to lift all extremities against gravity, oriented to self and place, able to follow one step commands. SpO2 ranged from low 80s-90s on 3L via [place], RN notified and aware. Pt transferred to sit EOB with supervision, and endorsed some dizziness in sitting. Sit <> Stand with RW and minA. Pt with 2-3 instances of buckling at bedside. attempted to perform hip flexion/marching, unable to maintain upright positioning at this time. Returned to supine and repositioned with minA from PT.  Overall the patient demonstrated deficits (see "PT Problem List") that impede the patient's functional abilities, safety, and mobility and would benefit from skilled PT intervention. Recommendation is STR due to pt's acute decline in functional status and decreased caregiver support.     Follow Up Recommendations SNF    Equipment Recommendations  None recommended by PT    Recommendations for Other Services       Precautions / Restrictions Precautions Precautions: Fall Restrictions Weight Bearing Restrictions: No      Mobility  Bed Mobility Overal bed mobility:  Needs Assistance Bed Mobility: Supine to Sit;Sit to Supine     Supine to sit: Supervision;HOB elevated Sit to supine: HOB elevated;Min assist      Transfers Overall transfer level: Needs assistance Equipment used: Rolling walker (2 wheeled) Transfers: Sit to/from Stand Sit to Stand: Min assist         General transfer comment: Pt with 2-3 instances of buckling at bedside. attempting to perform hip flexion/marching, unable to maintain upright positioning at this time  Ambulation/Gait             General Gait Details: deferred  Stairs            Wheelchair Mobility    Modified Rankin (Stroke Patients Only)       Balance Overall balance assessment: Needs assistance Sitting-balance support: Bilateral upper extremity supported Sitting balance-Leahy Scale: Fair       Standing balance-Leahy Scale: Poor                               Pertinent Vitals/Pain Pain Assessment: No/denies pain    Home Living Family/patient expects to be discharged to:: Private residence Living Arrangements: Children Available Help at Discharge: Family;Available PRN/intermittently Type of Home: House Home Access: Stairs to enter Entrance Stairs-Rails: Left Entrance Stairs-Number of Steps: 6 Home Layout: One level Home Equipment: Walker - 2 wheels;Cane - single point;Bedside commode Additional Comments: Pt HOH, has visual impairment, must speak very closely and loudly in ear    Prior Function Level of Independence: Needs assistance   Gait /  Transfers Assistance Needed: RW for household ambulation at baseline. in the last month the patient has had significantly more difficulty with mobility per family  ADL's / Homemaking Assistance Needed: son peforms IADLs, pt able to perform ADLs mod I until very recently        Hand Dominance   Dominant Hand: Right    Extremity/Trunk Assessment   Upper Extremity Assessment Upper Extremity Assessment: Generalized  weakness    Lower Extremity Assessment Lower Extremity Assessment: Generalized weakness    Cervical / Trunk Assessment Cervical / Trunk Assessment: Kyphotic  Communication   Communication: Other (comment);HOH(visual impairment as well)  Cognition Arousal/Alertness: Awake/alert Behavior During Therapy: WFL for tasks assessed/performed Overall Cognitive Status: Difficult to assess                                 General Comments: pt oriented to self and location      General Comments      Exercises     Assessment/Plan    PT Assessment Patient needs continued PT services  PT Problem List Decreased strength;Decreased mobility;Decreased safety awareness;Decreased knowledge of precautions;Decreased activity tolerance;Decreased balance;Decreased knowledge of use of DME       PT Treatment Interventions DME instruction;Therapeutic exercise;Gait training;Balance training;Stair training;Neuromuscular re-education;Functional mobility training;Therapeutic activities;Patient/family education    PT Goals (Current goals can be found in the Care Plan section)  Acute Rehab PT Goals Patient Stated Goal: to see the doctor PT Goal Formulation: With patient Time For Goal Achievement: 01/25/19 Potential to Achieve Goals: Good    Frequency Min 2X/week   Barriers to discharge Inaccessible home environment;Decreased caregiver support      Co-evaluation               AM-PAC PT "6 Clicks" Mobility  Outcome Measure Help needed turning from your back to your side while in a flat bed without using bedrails?: A Little Help needed moving from lying on your back to sitting on the side of a flat bed without using bedrails?: A Little Help needed moving to and from a bed to a chair (including a wheelchair)?: A Little Help needed standing up from a chair using your arms (e.g., wheelchair or bedside chair)?: A Little Help needed to walk in hospital room?: A Lot Help needed  climbing 3-5 steps with a railing? : Total 6 Click Score: 15    End of Session Equipment Utilized During Treatment: Gait belt Activity Tolerance: Patient tolerated treatment well Patient left: in bed;with call bell/phone within reach Nurse Communication: Mobility status PT Visit Diagnosis: Other abnormalities of gait and mobility (R26.89);Difficulty in walking, not elsewhere classified (R26.2);Muscle weakness (generalized) (M62.81);History of falling (Z91.81)    Time: 1610-9604 PT Time Calculation (min) (ACUTE ONLY): 23 min   Charges:   PT Evaluation $PT Eval Moderate Complexity: 1 Mod PT Treatments $Therapeutic Activity: 8-22 mins        Lieutenant Diego PT, DPT 4:23 PM,01/11/19 [phone]

## 2019-01-12 ENCOUNTER — Other Ambulatory Visit: Payer: Self-pay

## 2019-01-12 ENCOUNTER — Inpatient Hospital Stay
Admit: 2019-01-12 | Discharge: 2019-01-12 | Disposition: A | Discharge status: Home / Self Care | Payer: Medicare Other | Attending: Internal Medicine | Admitting: Internal Medicine

## 2019-01-12 DIAGNOSIS — N1832 Chronic kidney disease, stage 3b: Secondary | ICD-10-CM

## 2019-01-12 LAB — CBC
HCT: 34 % — ABNORMAL LOW (ref 36.0–46.0)
Hemoglobin: 9.5 g/dL — ABNORMAL LOW (ref 12.0–15.0)
MCH: 24.1 pg — ABNORMAL LOW (ref 26.0–34.0)
MCHC: 27.9 g/dL — ABNORMAL LOW (ref 30.0–36.0)
MCV: 86.1 fL (ref 80.0–100.0)
Platelets: 304 10*3/uL (ref 150–400)
RBC: 3.95 MIL/uL (ref 3.87–5.11)
RDW: 15.5 % (ref 11.5–15.5)
WBC: 8.7 10*3/uL (ref 4.0–10.5)
nRBC: 0 % (ref 0.0–0.2)

## 2019-01-12 LAB — BASIC METABOLIC PANEL
Anion gap: 13 (ref 5–15)
BUN: 22 mg/dL (ref 8–23)
CO2: 41 mmol/L — ABNORMAL HIGH (ref 22–32)
Calcium: 8.7 mg/dL — ABNORMAL LOW (ref 8.9–10.3)
Chloride: 87 mmol/L — ABNORMAL LOW (ref 98–111)
Creatinine, Ser: 0.41 mg/dL — ABNORMAL LOW (ref 0.44–1.00)
GFR calc Af Amer: >60 mL/min (ref >60)
GFR calc non Af Amer: >60 mL/min (ref >60)
Glucose, Bld: 70 mg/dL (ref 70–99)
Potassium: 3.5 mmol/L (ref 3.5–5.1)
Sodium: 141 mmol/L (ref 135–145)

## 2019-01-12 LAB — BLOOD GAS, ARTERIAL
Acid-Base Excess: 28.2 mmol/L — ABNORMAL HIGH (ref 0.0–2.0)
Bicarbonate: 56.7 mmol/L — ABNORMAL HIGH (ref 20.0–28.0)
FIO2: 0.28
O2 Saturation: 96.1 %
Patient temperature: 37
pCO2 arterial: 71 mmHg (ref 32.0–48.0)
pH, Arterial: 7.51 — ABNORMAL HIGH (ref 7.350–7.450)
pO2, Arterial: 75 mmHg — ABNORMAL LOW (ref 83.0–108.0)

## 2019-01-12 LAB — PTH, INTACT AND CALCIUM
Calcium, Total (PTH): 8.5 mg/dL — ABNORMAL LOW (ref 8.7–10.3)
PTH: 64 pg/mL (ref 15–65)

## 2019-01-12 LAB — CALCIUM, IONIZED: Calcium, Ionized, Serum: 4.9 mg/dL (ref 4.5–5.6)

## 2019-01-12 NOTE — Progress Notes (Signed)
Pt refused pravastatin and enoxaparin this shift. Stated she had too medicine in her allready

## 2019-01-12 NOTE — Progress Notes (Signed)
PROGRESS NOTE  Brenda Higgins TDV:[id number] DOB: 12/02/1935 DOA: 01/10/2019 PCP: System, Pcp Not In  HPI/Recap of past 48 hours: 83 year old with past medical history of diastolic heart failure, hypertension and COPD on chronic 2 L nasal cannula admitted on 11/19 after being brought in by her son for increased confusion, weakness.  Apparently, patient has been going downhill for the past 6 months but more so in the past few days.  According to the patient's son, she had been urinating much more frequently so she was evaluated by her primary care doctor and found to have no evidence of urinary tract infection.  They recommended that she stop her Lasix.  Few days later, patient son states that her legs have become more swollen and having weeping edema.  In the emergency room, patient had to be afebrile and normotensive.  Initially she was on her oxygen of 2 L nasal cannula.  Hemoglobin noted anemia, but chronic.  Abnormal labs were noteworthy for a BNP of 384.  CT scan of chest noted diffusely demineralized appearance of bones and ribs that can be seen in hyperparathyroidism.  Patchy opacities were noted concerning for pneumonia or aspiration.  Patient was given a dose of IV Rocephin for suspected leg cellulitis, although the admitting physician did not think that she had that.  Procalcitonin level also normal.  Following admission, patient had episodes of oxygen desaturation and an ABG noted marked hypercarbia and hypoxia.  However, respiratory suspected the sample may be incorrect and a repeat ABG noted hypoxia and hypercarbia, but compensated with a stable pH.  This morning, patient is much more awake and appears to be at her baseline, as confirmed by her son.  She has no complaints.  Seen by physical therapy who are recommending short-term skilled nursing for which her son agrees.  Assessment/Plan: Acute on chronic respiratory failure with hypercarbia and hypoxia from COPD exacerbation and acute on  chronic diastolic heart failure: Suspect that patient easily gets hypercarbic.  While she is somewhat compensated, she at times has significant oxygen desaturations.  Would prefer to keep her close to her baseline giving only oxygen for persistent episodes.  She is not apneic.  A big contributing factor is likely her heart failure.  Echocardiogram ordered and results are pending.  Have started IV Lasix and monitoring strict input and output.  Despite infiltrate seen on CT, procalcitonin level normal.  No reason for antibiotics.   Failure to thrive: Nutrition to evaluate caloric intake.  Speech therapy to check swallow evaluation.  Demineralization: Seen on CT.  Patient does not have hypercalcemia.  PTH level normal.  Code Status: DNR  Family Communication: Updated son by phone.  Disposition Plan: Son amenable to short-term skilled nursing.   Consultants:  None  Procedures:  Echocardiogram pending  Antimicrobials:  IV Rocephin x1 dose 11/19  DVT prophylaxis: Lovenox   Objective: Vitals:   01/12/19 0117 01/12/19 0832  BP: 113/69 134/71  Pulse: 61 86  Resp: 19   Temp: 98.3 F (36.8 C)   SpO2: 94% 98%    Intake/Output Summary (Last 24 hours) at 01/12/2019 1456 Last data filed at 01/12/2019 0900 Gross per 24 hour  Intake 240 ml  Output -  Net 240 ml   Filed Weights   01/11/19 1000  Weight: 36.3 kg   Body mass index is 19.29 kg/m.  Exam:   General: Awake, oriented x2, no acute distress  HEENT: Normocephalic and atraumatic, mucous membranes are dry  Neck: Supple, mild JVD  Cardiovascular: Regular rate and rhythm, frequent PACs, 2 out of 6 systolic ejection murmur  Respiratory: Decreased breath sounds throughout  Abdomen: Soft, nontender, nondistended, positive bowel sounds  Musculoskeletal: No clubbing or cyanosis, trace pitting edema from the knees down bilaterally  Skin: Chronic venous stasis  Psychiatry: Suspect she has some mild underlying  dementia, but no evidence of acute psychosis.   Data Reviewed: CBC: Recent Labs  Lab 01/10/19 1447 01/11/19 0521 01/12/19 0427  WBC 7.8 7.2 8.7  NEUTROABS 6.5  --   --   HGB 9.4* 8.7* 9.5*  HCT 33.4* 30.1* 34.0*  MCV 87.0 83.6 86.1  PLT 296 279 304   Basic Metabolic Panel: Recent Labs  Lab 01/10/19 1447 01/11/19 0521 01/12/19 0427  NA 139 143 141  K 3.6 3.5 3.5  CL 86* 89* 87*  CO2 38* 43* 41*  GLUCOSE 125* 91 70  BUN 36* 27* 22  CREATININE 0.54 0.43* 0.41*  CALCIUM 8.6* 8.3*  8.5* 8.7*   GFR: Estimated Creatinine Clearance: 27.6 mL/min (A) (by C-G formula based on SCr of 0.41 mg/dL (L)). Liver Function Tests: No results for input(s): AST, ALT, ALKPHOS, BILITOT, PROT, ALBUMIN in the last 168 hours. No results for input(s): LIPASE, AMYLASE in the last 168 hours. No results for input(s): AMMONIA in the last 168 hours. Coagulation Profile: No results for input(s): INR, PROTIME in the last 168 hours. Cardiac Enzymes: No results for input(s): CKTOTAL, CKMB, CKMBINDEX, TROPONINI in the last 168 hours. BNP (last 3 results) No results for input(s): PROBNP in the last 8760 hours. HbA1C: No results for input(s): HGBA1C in the last 72 hours. CBG: No results for input(s): GLUCAP in the last 168 hours. Lipid Profile: No results for input(s): CHOL, HDL, LDLCALC, TRIG, CHOLHDL, LDLDIRECT in the last 72 hours. Thyroid Function Tests: Recent Labs    01/11/19 0521  TSH 0.698   Anemia Panel: Recent Labs    01/11/19 0521  VITAMINB12 219  FOLATE 14.4  FERRITIN 6*  TIBC 445  IRON 19*   Urine analysis:    Component Value Date/Time   COLORURINE YELLOW (A) 01/10/2019 1514   APPEARANCEUR CLEAR (A) 01/10/2019 1514   APPEARANCEUR CLOUDY 08/20/2013 0848   LABSPEC 1.011 01/10/2019 1514   LABSPEC 1.010 08/20/2013 0848   PHURINE 6.0 01/10/2019 1514   GLUCOSEU NEGATIVE 01/10/2019 1514   GLUCOSEU NEGATIVE 08/20/2013 0848   HGBUR NEGATIVE 01/10/2019 1514   BILIRUBINUR  NEGATIVE 01/10/2019 1514   BILIRUBINUR NEGATIVE 08/20/2013 0848   KETONESUR NEGATIVE 01/10/2019 1514   PROTEINUR NEGATIVE 01/10/2019 1514   NITRITE NEGATIVE 01/10/2019 1514   LEUKOCYTESUR NEGATIVE 01/10/2019 1514   LEUKOCYTESUR 3+ 08/20/2013 0848   Sepsis Labs: @LABRCNTIP (procalcitonin:4,lacticidven:4)  ) Recent Results (from the past 240 hour(s))  SARS CORONAVIRUS 2 (TAT 6-24 HRS) Nasopharyngeal Nasopharyngeal Swab     Status: None   Collection Time: 01/10/19  9:38 PM   Specimen: Nasopharyngeal Swab  Result Value Ref Range Status   SARS Coronavirus 2 NEGATIVE NEGATIVE Final    Comment: (NOTE) SARS-CoV-2 target nucleic acids are NOT DETECTED. The SARS-CoV-2 RNA is generally detectable in upper and lower respiratory specimens during the acute phase of infection. Negative results do not preclude SARS-CoV-2 infection, do not rule out co-infections with other pathogens, and should not be used as the sole basis for treatment or other patient management decisions. Negative results must be combined with clinical observations, patient history, and epidemiological information. The expected result is Negative. Fact Sheet for Patients: HairSlick.no Fact Sheet for  Healthcare Providers: https://www.woods-mathews.com/ This test is not yet approved or cleared by the [place] and  has been authorized for detection and/or diagnosis of SARS-CoV-2 by FDA under an Emergency Use Authorization (EUA). This EUA will remain  in effect (meaning this test can be used) for the duration of the COVID-19 declaration under Section 56 4(b)(1) of the Act, 21 U.S.C. section 360bbb-3(b)(1), unless the authorization is terminated or revoked sooner. Performed at [facility], [place] [address] [place], [place] [address]       Studies: No results found.  Scheduled Meds: . aspirin EC  81 mg Oral Daily  . docusate sodium  100 mg Oral Daily  . enoxaparin  (LOVENOX) injection  30 mg Subcutaneous Q24H  . furosemide  20 mg Intravenous Q12H  . polyethylene glycol  17 g Oral Daily  . pravastatin  10 mg Oral q1800  . umeclidinium-vilanterol  1 puff Inhalation Daily  Avva and wheezing is gone  Continuous Infusions:   LOS: 1 day     Annita Brod, MD [facility]  To reach me or the doctor on call, go to: [email or web address] Password [facility]  01/12/2019, 2:56 PM

## 2019-01-12 NOTE — Evaluation (Signed)
Clinical/Bedside Swallow Evaluation Patient Details  Name: Brenda Higgins MRN: [id number] Date of Birth: 02-13-1936  Today's Date: 01/12/2019 Time: SLP Start Time (ACUTE ONLY): 1325 SLP Stop Time (ACUTE ONLY): 1420 SLP Time Calculation (min) (ACUTE ONLY): 55 min  Past Medical History:  Past Medical History:  Diagnosis Date  . Chronic respiratory failure (HCC)   . COPD (chronic obstructive pulmonary disease) (HCC)   . Hyperlipidemia    Past Surgical History:  Past Surgical History:  Procedure Laterality Date  . HIP SURGERY Right   . INTRAMEDULLARY (IM) NAIL INTERTROCHANTERIC Left 09/05/2014   Procedure: INTRAMEDULLARY (IM) NAIL INTERTROCHANTRIC;  Surgeon: Kennedy Bucker, MD;  Location: [facility] ORS;  Service: Orthopedics;  Laterality: Left;   HPI:  Pt is a 83 year old with past medical history of diastolic heart failure, cardiomegaly, hypertension and COPD on chronic 2 L nasal cannula admitted on 11/19 after being brought in by her son for increased confusion, weakness.  Apparently, patient has been going downhill for the past 6 months but more so in the past few days.  According to the patient's son, she had been urinating much more frequently so she was evaluated by her primary care doctor and found to have no evidence of urinary tract infection.  They recommended that she stop her Lasix.  Few days later, patient son states that her legs have become more swollen and having weeping edema.  Per MD notes, Acute on chronic respiratory failure with hypercarbia and hypoxia from COPD exacerbation and acute on chronic diastolic heart failure: Suspect that patient easily gets hypercarbic.  While she is somewhat compensated, she at times has significant oxygen desaturations.  Would prefer to keep her close to her baseline giving only oxygen for persistent episodes.  She is not apneic.  A big contributing factor is likely her heart failure; Lasix has been restarted, and pt is being monitored. Per chest CT:  "patchy opacities in the right lower lobe with dependent atelectasis and a small right trace left pleural effusion; Extensive severe Emphysema; Progressive exaggeration of the thoracic kyphosis a background of scoliotic curvature and increased AP diameter of the chest; New compression deformities at T8, T9 and T10; Several areas of thinning and sclerotic change of the ribs". Head CT: No acute intracranial abnormality; Chronic microvascular angiopathy and parenchymal volume loss.    Assessment / Plan / Recommendation Clinical Impression  Pt appears to present w/ adequate oropharyngeal phase swallowing function but somewhat impacted by her Edentulous status. She is also significantly impacted by her positioning upright in bed - needed much support w/ pillows for comfort and sitting forward to feed self - noted changes of the Spine per CT. W/ support and tray setup/positioning, pt appears to be able to consume a semi-modified diet w/out immediate, overt clinical s/s of aspiration noted. Pt boluses of thin liquids, purees, and softened solids broken down and moistened without s/s of aspiration -- no overt coughing or decline in respiratory effort during/post trials noted. Vocal quality remained clear b/t trials when assessed. Oral structures and functioning assessed through bolus management during the oral phase appeared [facility] w/ no oral weakness noted. During the oral phase, pt exhibited adequate bolus management and A-P transfer for swallowing w/ most trials; oral clearing achieved adequate given time w/ the increased textured foods (meats chopped) d/t her Edentulous status baseline. Pt required min cues intermittently; she is HOH. Recommend Dysphagia 3 diet w/ MINCED meats, gravy and thin liquids; Pills in Puree for safer, easier swallowing as needed; aspiration precautions;  support at meals. ST services will monitor for any further needs next 2-3 days. Pt is at increase risk for micro-aspiration secondary to her  declined Pulmonary status; positioning challenges, however, pt has not had noted Pulmonary decline in recent 6+ months as evidenced by declined presentation w/ CXRs or pneumonia reported. Recommend dietician f/u for nutritional support.  SLP Visit Diagnosis: Dysphagia, oral phase (R13.11)(Edentulous status baseline)    Aspiration Risk  Mild aspiration risk;Risk for inadequate nutrition/hydration(but reduced following general precautions)    Diet Recommendation  Mech soft diet (dys level 3) w/ MINCED meats, gravies added to moisten well; Thin liquids. General aspiration precautions; positioning support at all meals. Rest breaks during meals.   Medication Administration: Whole meds with puree(as needed for safer swallowing)    Other  Recommendations Recommended Consults: (Dietician f/u) Oral Care Recommendations: Oral care BID;Staff/trained caregiver to provide oral care Other Recommendations: (n/a at this time)   Follow up Recommendations None      Frequency and Duration min 2x/week  1 week       Prognosis Prognosis for Safe Diet Advancement: Good      Swallow Study   General HPI: Pt is a 83 year old with past medical history of diastolic heart failure, cardiomegaly, hypertension and COPD on chronic 2 L nasal cannula admitted on 11/19 after being brought in by her son for increased confusion, weakness.  Apparently, patient has been going downhill for the past 6 months but more so in the past few days.  According to the patient's son, she had been urinating much more frequently so she was evaluated by her primary care doctor and found to have no evidence of urinary tract infection.  They recommended that she stop her Lasix.  Few days later, patient son states that her legs have become more swollen and having weeping edema.  Per MD notes, Acute on chronic respiratory failure with hypercarbia and hypoxia from COPD exacerbation and acute on chronic diastolic heart failure: Suspect that patient  easily gets hypercarbic.  While she is somewhat compensated, she at times has significant oxygen desaturations.  Would prefer to keep her close to her baseline giving only oxygen for persistent episodes.  She is not apneic.  A big contributing factor is likely her heart failure; Lasix has been restarted, and pt is being monitored. Per chest CT: "patchy opacities in the right lower lobe with dependent atelectasis and a small right trace left pleural effusion; Extensive severe Emphysema; Progressive exaggeration of the thoracic kyphosis a background of scoliotic curvature and increased AP diameter of the chest; New compression deformities at T8, T9 and T10; Several areas of thinning and sclerotic change of the ribs". Head CT: No acute intracranial abnormality; Chronic microvascular angiopathy and parenchymal volume loss.  Type of Study: Bedside Swallow Evaluation Previous Swallow Assessment: none Diet Prior to this Study: Regular;Thin liquids(at home - "soft foods" per pt as she is Edentulous) Temperature Spikes Noted: No(wbc 8.7) Respiratory Status: Nasal cannula(2L her baseline) History of Recent Intubation: No Behavior/Cognition: Alert;Cooperative;Pleasant mood;Distractible;Requires cueing(HOH) Oral Cavity Assessment: Within Functional Limits Oral Care Completed by SLP: Recent completion by staff Oral Cavity - Dentition: Edentulous Vision: Functional for self-feeding Self-Feeding Abilities: Needs set up;Needs assist Patient Positioning: Upright in bed(needed positioning) Baseline Vocal Quality: Normal(gravely - min) Volitional Cough: Strong Volitional Swallow: Able to elicit    Oral/Motor/Sensory Function Overall Oral Motor/Sensory Function: Within functional limits   Ice Chips Ice chips: Not tested   Thin Liquid Thin Liquid: Within functional limits Presentation: Self Fed;Straw(~4  ozs total ) Other Comments: tea, soup via straw    Nectar Thick Nectar Thick Liquid: Not tested   Honey Thick  Honey Thick Liquid: Not tested   Puree Puree: Within functional limits Presentation: Spoon;Self Fed(8 trials)   Solid     Solid: Impaired Presentation: Spoon;Self Fed(9+ trials) Oral Phase Impairments: Impaired mastication(Edentulous) Oral Phase Functional Implications: Impaired mastication(Edentulous) Pharyngeal Phase Impairments: (none) Other Comments: given time, pt mashed/gummed foods adequately for swallowing -- meats were well broken down and gravy added       Orinda Kenner, MS, CCC-SLP , 01/12/2019,3:14 PM

## 2019-01-13 LAB — BASIC METABOLIC PANEL
Anion gap: 11 (ref 5–15)
BUN: 20 mg/dL (ref 8–23)
CO2: 40 mmol/L — ABNORMAL HIGH (ref 22–32)
Calcium: 9 mg/dL (ref 8.9–10.3)
Chloride: 88 mmol/L — ABNORMAL LOW (ref 98–111)
Creatinine, Ser: 0.46 mg/dL (ref 0.44–1.00)
GFR calc Af Amer: >60 mL/min (ref >60)
GFR calc non Af Amer: >60 mL/min (ref >60)
Glucose, Bld: 89 mg/dL (ref 70–99)
Potassium: 3.8 mmol/L (ref 3.5–5.1)
Sodium: 139 mmol/L (ref 135–145)

## 2019-01-13 LAB — CBC
HCT: 34.4 % — ABNORMAL LOW (ref 36.0–46.0)
Hemoglobin: 10.2 g/dL — ABNORMAL LOW (ref 12.0–15.0)
MCH: 24 pg — ABNORMAL LOW (ref 26.0–34.0)
MCHC: 29.7 g/dL — ABNORMAL LOW (ref 30.0–36.0)
MCV: 80.9 fL (ref 80.0–100.0)
Platelets: 311 10*3/uL (ref 150–400)
RBC: 4.25 MIL/uL (ref 3.87–5.11)
RDW: 15.7 % — ABNORMAL HIGH (ref 11.5–15.5)
WBC: 8.4 10*3/uL (ref 4.0–10.5)
nRBC: 0 % (ref 0.0–0.2)

## 2019-01-13 LAB — ECHOCARDIOGRAM COMPLETE
Height: 54 in
Weight: 1280 oz

## 2019-01-13 MED ORDER — ENSURE ENLIVE PO LIQD: 237.0000 mL | Freq: Two times a day (BID) | ORAL | Status: DC

## 2019-01-13 NOTE — Progress Notes (Signed)
PROGRESS NOTE  EVERLI GENCO YOK:[id number] DOB: 1936/01/14 DOA: 01/10/2019 PCP: System, Pcp Not In  HPI/Recap of past 65 hours: 83 year old with past medical history of diastolic heart failure, hypertension and COPD on chronic 2 L nasal cannula admitted on 11/19 after being brought in by her son for increased confusion, weakness.  Apparently, patient has been going downhill for the past 6 months but more so in the past few days.  According to the patient's son, she had been urinating much more frequently so she was evaluated by her primary care doctor and found to have no evidence of urinary tract infection.  They recommended that she stop her Lasix.  Few days later, patient son states that her legs have become more swollen and having weeping edema.  In the emergency room, patient had to be afebrile and normotensive.  Initially she was on her oxygen of 2 L nasal cannula.  Hemoglobin noted anemia, but chronic.  Abnormal labs were noteworthy for a BNP of 384.  CT scan of chest noted diffusely demineralized appearance of bones and ribs that can be seen in hyperparathyroidism.  Patchy opacities were noted concerning for pneumonia or aspiration.  Patient was given a dose of IV Rocephin for suspected leg cellulitis, although the admitting physician did not think that she had that.  Procalcitonin level also normal.  Following admission, patient had episodes of oxygen desaturation and an ABG noted marked hypercarbia and hypoxia.  However, respiratory suspected the sample may be incorrect and a repeat ABG noted hypoxia and hypercarbia, but compensated with a stable pH.  By following morning morning, patient more awake and appears to be at her baseline, as confirmed by her son.  Seen by physical therapy who are recommending short-term skilled nursing for which her son agrees.  This morning, patient is doing okay.  Complains of some neck pain because she says she slept awkwardly.  Otherwise, she says her  breathing is okay.  Assessment/Plan: Acute on chronic respiratory failure with hypercarbia and hypoxia from COPD exacerbation and acute on chronic diastolic heart failure: Suspect that patient easily gets hypercarbic.  While she is somewhat compensated, she at times has significant oxygen desaturations.  Would prefer to keep her close to her baseline giving only oxygen for persistent episodes.  She is not apneic.  A big contributing factor is likely her heart failure.  Echocardiogram notes preserved ejection fraction and no comment on diastolic dysfunction.  She is over 1.5 L of fluid.  Despite infiltrate seen on CT, procalcitonin level normal.  No reason for antibiotics.   Failure to thrive: Nutrition to evaluate caloric intake.  Speech therapy evaluated patient recommending dysphagia 3 diet with minced meats and pills in pure. Demineralization: Seen on CT.  Patient does not have hypercalcemia.  PTH level normal.  Code Status: DNR  Family Communication: Son at the bedside  Disposition Plan: Skilled nursing facility in the next 1 to 2 days   Consultants:  None  Procedures:  Echocardiogram preserved ejection fraction.  Moderately dilated left and right atrium.  Mild to moderate tricuspid regurg with mild mitral regurg.  No comment on diastolic dysfunction.  Antimicrobials:  IV Rocephin x1 dose 11/19  DVT prophylaxis: Lovenox   Objective: Vitals:   01/12/19 2013 01/13/19 0808  BP: (!) 146/90 (!) 121/56  Pulse: 77 66  Resp: 18   Temp: 98.1 F (36.7 C) 98.1 F (36.7 C)  SpO2: 93% 100%    Intake/Output Summary (Last 24 hours) at 01/13/2019 1155  Last data filed at 01/13/2019 0900 Gross per 24 hour  Intake 120 ml  Output 2000 ml  Net -1880 ml   Filed Weights   01/11/19 1000  Weight: 36.3 kg   Body mass index is 19.29 kg/m.  Exam:   General: Awake, oriented x2, no acute distress, chronically hard of hearing  HEENT: Normocephalic and atraumatic, mucous membranes  are dry  Neck: Supple, mild JVD  Cardiovascular: Regular rate and rhythm, frequent PACs, 2 out of 6 systolic ejection murmur  Respiratory: Decreased breath sounds throughout  Abdomen: Soft, nontender, nondistended, positive bowel sounds  Musculoskeletal: No clubbing or cyanosis, trace pitting edema from the knees down bilaterally  Skin: Chronic venous stasis  Psychiatry: Suspect she has some mild underlying dementia, but no evidence of acute psychosis.   Data Reviewed: CBC: Recent Labs  Lab 01/10/19 1447 01/11/19 0521 01/12/19 0427 01/13/19 0606  WBC 7.8 7.2 8.7 8.4  NEUTROABS 6.5  --   --   --   HGB 9.4* 8.7* 9.5* 10.2*  HCT 33.4* 30.1* 34.0* 34.4*  MCV 87.0 83.6 86.1 80.9  PLT 296 279 304 311   Basic Metabolic Panel: Recent Labs  Lab 01/10/19 1447 01/11/19 0521 01/12/19 0427 01/13/19 0606  NA 139 143 141 139  K 3.6 3.5 3.5 3.8  CL 86* 89* 87* 88*  CO2 38* 43* 41* 40*  GLUCOSE 125* 91 70 89  BUN 36* 27* 22 20  CREATININE 0.54 0.43* 0.41* 0.46  CALCIUM 8.6* 8.3*  8.5* 8.7* 9.0   GFR: Estimated Creatinine Clearance: 27.6 mL/min (by C-G formula based on SCr of 0.46 mg/dL). Liver Function Tests: No results for input(s): AST, ALT, ALKPHOS, BILITOT, PROT, ALBUMIN in the last 168 hours. No results for input(s): LIPASE, AMYLASE in the last 168 hours. No results for input(s): AMMONIA in the last 168 hours. Coagulation Profile: No results for input(s): INR, PROTIME in the last 168 hours. Cardiac Enzymes: No results for input(s): CKTOTAL, CKMB, CKMBINDEX, TROPONINI in the last 168 hours. BNP (last 3 results) No results for input(s): PROBNP in the last 8760 hours. HbA1C: No results for input(s): HGBA1C in the last 72 hours. CBG: No results for input(s): GLUCAP in the last 168 hours. Lipid Profile: No results for input(s): CHOL, HDL, LDLCALC, TRIG, CHOLHDL, LDLDIRECT in the last 72 hours. Thyroid Function Tests: Recent Labs    01/11/19 0521  TSH 0.698    Anemia Panel: Recent Labs    01/11/19 0521  VITAMINB12 219  FOLATE 14.4  FERRITIN 6*  TIBC 445  IRON 19*   Urine analysis:    Component Value Date/Time   COLORURINE YELLOW (A) 01/10/2019 1514   APPEARANCEUR CLEAR (A) 01/10/2019 1514   APPEARANCEUR CLOUDY 08/20/2013 0848   LABSPEC 1.011 01/10/2019 1514   LABSPEC 1.010 08/20/2013 0848   PHURINE 6.0 01/10/2019 1514   GLUCOSEU NEGATIVE 01/10/2019 1514   GLUCOSEU NEGATIVE 08/20/2013 0848   HGBUR NEGATIVE 01/10/2019 1514   BILIRUBINUR NEGATIVE 01/10/2019 1514   BILIRUBINUR NEGATIVE 08/20/2013 0848   KETONESUR NEGATIVE 01/10/2019 1514   PROTEINUR NEGATIVE 01/10/2019 1514   NITRITE NEGATIVE 01/10/2019 1514   LEUKOCYTESUR NEGATIVE 01/10/2019 1514   LEUKOCYTESUR 3+ 08/20/2013 0848   Sepsis Labs: @LABRCNTIP (procalcitonin:4,lacticidven:4)  ) Recent Results (from the past 240 hour(s))  SARS CORONAVIRUS 2 (TAT 6-24 HRS) Nasopharyngeal Nasopharyngeal Swab     Status: None   Collection Time: 01/10/19  9:38 PM   Specimen: Nasopharyngeal Swab  Result Value Ref Range Status   SARS Coronavirus 2  NEGATIVE NEGATIVE Final    Comment: (NOTE) SARS-CoV-2 target nucleic acids are NOT DETECTED. The SARS-CoV-2 RNA is generally detectable in upper and lower respiratory specimens during the acute phase of infection. Negative results do not preclude SARS-CoV-2 infection, do not rule out co-infections with other pathogens, and should not be used as the sole basis for treatment or other patient management decisions. Negative results must be combined with clinical observations, patient history, and epidemiological information. The expected result is Negative. Fact Sheet for Patients: SugarRoll.be Fact Sheet for Healthcare Providers: https://www.woods-mathews.com/ This test is not yet approved or cleared by the [place] FDA and  has been authorized for detection and/or diagnosis of SARS-CoV-2 by FDA  under an Emergency Use Authorization (EUA). This EUA will remain  in effect (meaning this test can be used) for the duration of the COVID-19 declaration under Section 56 4(b)(1) of the Act, 21 U.S.C. section 360bbb-3(b)(1), unless the authorization is terminated or revoked sooner. Performed at [facility], [place] [address], [place], [place] [address]       Studies: No results found.  Scheduled Meds: . aspirin EC  81 mg Oral Daily  . docusate sodium  100 mg Oral Daily  . enoxaparin (LOVENOX) injection  30 mg Subcutaneous Q24H  . furosemide  20 mg Intravenous Q12H  . polyethylene glycol  17 g Oral Daily  . pravastatin  10 mg Oral q1800  . umeclidinium-vilanterol  1 puff Inhalation Daily  Avva and wheezing is gone  Continuous Infusions:   LOS: 2 days     Annita Brod, MD [facility]  To reach me or the doctor on call, go to: [email or web address] Password [facility]  01/13/2019, 11:55 AM

## 2019-01-13 NOTE — Progress Notes (Signed)
Initial Nutrition Assessment  DOCUMENTATION CODES:   Severe malnutrition in context of chronic illness  INTERVENTION:  Provide Ensure Enlive po BID, each supplement provides 350 kcal and 20 grams of protein. Patient prefers strawberry.  Provide MVI daily.  NUTRITION DIAGNOSIS:   Severe Malnutrition related to chronic illness(COPD, CHF) as evidenced by severe fat depletion, severe muscle depletion.  GOAL:   Patient will meet greater than or equal to 90% of their needs  MONITOR:   PO intake, Supplement acceptance, Labs, Weight trends, I & O's  REASON FOR ASSESSMENT:   Malnutrition Screening Tool, Consult Assessment of nutrition requirement/status  ASSESSMENT:   83 year old female with PMHx of COPD, HLD, CHF, HTN admitted with COPD exacerbation, acute on chronic diastolic heart failure, FTT.   -Following SLP evaluation patient was recommended to have dysphagia 3 diet with minced meats and thin liquids.  Met with patient and her son at bedside. Son reports patient has been declining over the past several months. She attempts to eat 2-3 meals per day but is eating <50% of most meals lately. He reports at baseline she has a good appetite. Son prepares high-calorie, high-protein meals for her including spaghetti and soft meats with sides. Patient has been having some difficulty chewing and swallowing lately but is tolerating her mechanical soft diet. Patient also drinks strawberry Ensure at home. Patient reports her appetite is starting to improve now. According to chart she ate 0% of breakfast but she was ready to try something for lunch.  Son reports patient has weighed 80 lbs for a while now but used to be 89 lbs a while ago. According to [facility] patient was 43.1 kg on 05/19/2017 (94.82 lbs). Unsure if she was measured on this admission as her current weight appears to be a stated weight. Unable to trend significance of weight loss.  Medications reviewed and include: Colace 100 mg  daily, Lasix 20 mg Q12hrs IV, Miralax.  Labs reviewed: Chloride 88, CO2 40.  NUTRITION - FOCUSED PHYSICAL EXAM:    Most Recent Value  Orbital Region  Severe depletion  Upper Arm Region  Severe depletion  Thoracic and Lumbar Region  Severe depletion  Buccal Region  Severe depletion  Temple Region  Severe depletion  Clavicle Bone Region  Severe depletion  Clavicle and Acromion Bone Region  Severe depletion  Scapular Bone Region  Severe depletion  Dorsal Hand  Severe depletion  Patellar Region  Severe depletion  Anterior Thigh Region  Severe depletion  Posterior Calf Region  Severe depletion  Edema (RD Assessment)  None  Hair  Reviewed  Eyes  Reviewed  Mouth  Reviewed  Skin  Reviewed  Nails  Reviewed     Diet Order:   Diet Order            DIET DYS 3 Room service appropriate? Yes; Fluid consistency: Thin  Diet effective now             EDUCATION NEEDS:   No education needs have been identified at this time  Skin:  Skin Assessment: Reviewed RN Assessment  Last BM:  01/12/2019 - medium type 2  Height:   Ht Readings from Last 1 Encounters:  01/11/19 _0  (1.372 m)   Weight:   Wt Readings from Last 1 Encounters:  01/11/19 36.3 kg   Ideal Body Weight:  37.2 kg  BMI:  Body mass index is 19.29 kg/m.  Estimated Nutritional Needs:   Kcal:  1200-1400  Protein:  60-70 grams  Fluid:  1.2-1.4 L/day  Jacklynn Barnacle, MS, RD, LDN Office: [phone] Pager: [phone] After Hours/Weekend Pager: [phone]

## 2019-01-14 DIAGNOSIS — E43 Unspecified severe protein-calorie malnutrition: Secondary | ICD-10-CM | POA: Diagnosis present

## 2019-01-14 LAB — SARS CORONAVIRUS 2 (TAT 6-24 HRS): SARS Coronavirus 2: NEGATIVE

## 2019-01-14 NOTE — Progress Notes (Signed)
PROGRESS NOTE  Brenda Higgins GNF:[id number] DOB: [birth year] DOA: 01/10/2019 PCP: System, Pcp Not In  HPI/Recap of past 8 hours: 83 year old with past medical history of diastolic heart failure, hypertension and COPD on chronic 2 L nasal cannula admitted on 11/19 after being brought in by her son for increased confusion, weakness.  Apparently, patient has been going downhill for the past 6 months but more so in the past few days.  According to the patient's son, she had been urinating much more frequently so she was evaluated by her primary care doctor and found to have no evidence of urinary tract infection.  They recommended that she stop her Lasix.  Few days later, patient son states that her legs have become more swollen and having weeping edema.  In the emergency room, patient had to be afebrile and normotensive.  Initially she was on her oxygen of 2 L nasal cannula.  Hemoglobin noted anemia, but chronic.  Abnormal labs were noteworthy for a BNP of 384.  CT scan of chest noted diffusely demineralized appearance of bones and ribs that can be seen in hyperparathyroidism.  Patchy opacities were noted concerning for pneumonia or aspiration.  Patient was given a dose of IV Rocephin for suspected leg cellulitis, although the admitting physician did not think that she had that.  Procalcitonin level also normal.  Following admission, patient had episodes of oxygen desaturation and an ABG noted marked hypercarbia and hypoxia.  However, respiratory suspected the sample may be incorrect and a repeat ABG noted hypoxia and hypercarbia, but compensated with a stable pH.  By following morning, patient more awake and appears to be at her baseline, as confirmed by her son.  Seen by physical therapy who are recommending short-term skilled nursing for which her son agrees.  Patient continues to remain awake.  No major complaints.  Awaiting skilled nursing.  Assessment/Plan: Acute on chronic respiratory failure with  hypercarbia and hypoxia from COPD exacerbation and acute on chronic diastolic heart failure: Suspect that patient easily gets hypercarbic.  While she is somewhat compensated, she at times has significant oxygen desaturations.  Would prefer to keep her close to her baseline giving only oxygen for persistent episodes.  She is not apneic.  A big contributing factor is likely her heart failure.  Echocardiogram notes preserved ejection fraction and no comment on diastolic dysfunction.  She is over 1.5 L of fluid.  Despite infiltrate seen on CT, procalcitonin level normal.  No reason for antibiotics.   Severe protein calorie malnutrition/failure to thrive: Seen by nutrition.  Patient meets criteria for severe malnutrition is related to her chronic illnesses of COPD and CHF as evidenced by severe fat and muscle depletion.  On Ensure Enlive twice daily plus multivitamin.  Speech therapy evaluated patient recommending dysphagia 3 diet with minced meats and pills in pure.  Demineralization: Seen on CT.  Patient does not have hypercalcemia.  PTH level normal.  Code Status: DNR  Family Communication: Updated son by phone  Disposition Plan: Skilled nursing facility when bed available   Consultants:  None  Procedures:  Echocardiogram preserved ejection fraction.  Moderately dilated left and right atrium.  Mild to moderate tricuspid regurg with mild mitral regurg.  No comment on diastolic dysfunction.  Antimicrobials:  IV Rocephin x1 dose 11/19  DVT prophylaxis: Lovenox   Objective: Vitals:   01/13/19 2057 01/14/19 0627  BP: 117/60 (!) 126/58  Pulse: 77 66  Resp: 16 17  Temp: 98.7 F (37.1 C) 98.3 F (36.8  C)  SpO2: (!) 86% 100%    Intake/Output Summary (Last 24 hours) at 01/14/2019 1259 Last data filed at 01/14/2019 1115 Gross per 24 hour  Intake 120 ml  Output 650 ml  Net -530 ml   Filed Weights   01/11/19 1000 01/13/19 1157 01/14/19 0627  Weight: 36.3 kg 35.9 kg 34.9 kg    Body mass index is 18.55 kg/m.  Exam:   General: Awake, oriented x2, no acute distress, chronically hard of hearing  HEENT: Normocephalic and atraumatic, mucous membranes are dry  Neck: Supple, mild JVD  Cardiovascular: Regular rate and rhythm, frequent PACs, 2 out of 6 systolic ejection murmur  Respiratory: Decreased breath sounds throughout  Abdomen: Soft, nontender, nondistended, positive bowel sounds  Musculoskeletal: No clubbing or cyanosis, trace pitting edema from the knees down bilaterally  Skin: Chronic venous stasis  Psychiatry: Suspect she has some mild underlying dementia, but no evidence of acute psychosis.   Data Reviewed: CBC: Recent Labs  Lab 01/10/19 1447 01/11/19 0521 01/12/19 0427 01/13/19 0606  WBC 7.8 7.2 8.7 8.4  NEUTROABS 6.5  --   --   --   HGB 9.4* 8.7* 9.5* 10.2*  HCT 33.4* 30.1* 34.0* 34.4*  MCV 87.0 83.6 86.1 80.9  PLT 296 279 304 311   Basic Metabolic Panel: Recent Labs  Lab 01/10/19 1447 01/11/19 0521 01/12/19 0427 01/13/19 0606  NA 139 143 141 139  K 3.6 3.5 3.5 3.8  CL 86* 89* 87* 88*  CO2 38* 43* 41* 40*  GLUCOSE 125* 91 70 89  BUN 36* 27* 22 20  CREATININE 0.54 0.43* 0.41* 0.46  CALCIUM 8.6* 8.3*  8.5* 8.7* 9.0   GFR: Estimated Creatinine Clearance: 27.6 mL/min (by C-G formula based on SCr of 0.46 mg/dL). Liver Function Tests: No results for input(s): AST, ALT, ALKPHOS, BILITOT, PROT, ALBUMIN in the last 168 hours. No results for input(s): LIPASE, AMYLASE in the last 168 hours. No results for input(s): AMMONIA in the last 168 hours. Coagulation Profile: No results for input(s): INR, PROTIME in the last 168 hours. Cardiac Enzymes: No results for input(s): CKTOTAL, CKMB, CKMBINDEX, TROPONINI in the last 168 hours. BNP (last 3 results) No results for input(s): PROBNP in the last 8760 hours. HbA1C: No results for input(s): HGBA1C in the last 72 hours. CBG: No results for input(s): GLUCAP in the last 168 hours.  Lipid Profile: No results for input(s): CHOL, HDL, LDLCALC, TRIG, CHOLHDL, LDLDIRECT in the last 72 hours. Thyroid Function Tests: No results for input(s): TSH, T4TOTAL, FREET4, T3FREE, THYROIDAB in the last 72 hours. Anemia Panel: No results for input(s): VITAMINB12, FOLATE, FERRITIN, TIBC, IRON, RETICCTPCT in the last 72 hours. Urine analysis:    Component Value Date/Time   COLORURINE YELLOW (A) 01/10/2019 1514   APPEARANCEUR CLEAR (A) 01/10/2019 1514   APPEARANCEUR CLOUDY 08/20/2013 0848   LABSPEC 1.011 01/10/2019 1514   LABSPEC 1.010 08/20/2013 0848   PHURINE 6.0 01/10/2019 1514   GLUCOSEU NEGATIVE 01/10/2019 1514   GLUCOSEU NEGATIVE 08/20/2013 0848   HGBUR NEGATIVE 01/10/2019 1514   BILIRUBINUR NEGATIVE 01/10/2019 1514   BILIRUBINUR NEGATIVE 08/20/2013 0848   KETONESUR NEGATIVE 01/10/2019 1514   PROTEINUR NEGATIVE 01/10/2019 1514   NITRITE NEGATIVE 01/10/2019 1514   LEUKOCYTESUR NEGATIVE 01/10/2019 1514   LEUKOCYTESUR 3+ 08/20/2013 0848   Sepsis Labs: @LABRCNTIP (procalcitonin:4,lacticidven:4)  ) Recent Results (from the past 240 hour(s))  SARS CORONAVIRUS 2 (TAT 6-24 HRS) Nasopharyngeal Nasopharyngeal Swab     Status: None   Collection Time: 01/10/19  9:38 PM   Specimen: Nasopharyngeal Swab  Result Value Ref Range Status   SARS Coronavirus 2 NEGATIVE NEGATIVE Final    Comment: (NOTE) SARS-CoV-2 target nucleic acids are NOT DETECTED. The SARS-CoV-2 RNA is generally detectable in upper and lower respiratory specimens during the acute phase of infection. Negative results do not preclude SARS-CoV-2 infection, do not rule out co-infections with other pathogens, and should not be used as the sole basis for treatment or other patient management decisions. Negative results must be combined with clinical observations, patient history, and epidemiological information. The expected result is Negative. Fact Sheet for Patients: SugarRoll.be Fact  Sheet for Healthcare Providers: https://www.woods-mathews.com/ This test is not yet approved or cleared by the [place] FDA and  has been authorized for detection and/or diagnosis of SARS-CoV-2 by FDA under an Emergency Use Authorization (EUA). This EUA will remain  in effect (meaning this test can be used) for the duration of the COVID-19 declaration under Section 56 4(b)(1) of the Act, 21 U.S.C. section 360bbb-3(b)(1), unless the authorization is terminated or revoked sooner. Performed at [facility], [place] [address], [place], [place] [address]       Studies: No results found.  Scheduled Meds: . aspirin EC  81 mg Oral Daily  . docusate sodium  100 mg Oral Daily  . enoxaparin (LOVENOX) injection  30 mg Subcutaneous Q24H  . feeding supplement (ENSURE ENLIVE)  237 mL Oral BID BM  . furosemide  20 mg Intravenous Q12H  . polyethylene glycol  17 g Oral Daily  . pravastatin  10 mg Oral q1800  . umeclidinium-vilanterol  1 puff Inhalation Daily  Avva and wheezing is gone  Continuous Infusions:   LOS: 3 days     Annita Brod, MD [facility]  To reach me or the doctor on call, go to: [email or web address] Password [email or web address]  01/14/2019, 12:59 PM

## 2019-01-14 NOTE — Progress Notes (Signed)
Speech Language Pathology Treatment: Dysphagia  Patient Details Name: Brenda Higgins MRN: [id number] DOB: January 28, 1936 Today's Date: 01/14/2019 Time: 3810-1751 SLP Time Calculation (min) (ACUTE ONLY): 40 min  Assessment / Plan / Recommendation Clinical Impression  Pt seen today for ongoing assessment of toleration of oral diet; education on diet consistency and aspiration precautions. Pt is Edentulous and benefits from well-chopped foods and support w/ positioning upright for any oral intake d/t Kyphosis. She can feed herself w/ setup and support. Pt given education on chopping foods into small bites/pieces and moistening well for mashing/gumming; and asking for help w/ positioning upright in bed for eating/drinking every time. Pt agreed.  Pt continues to present w/ adequate oropharyngeal phase swallowing function but somewhat impacted by her Edentulous status. She is also significantly impacted by her positioning upright in bed - needed much support w/ pillows for comfort and sitting forward to feed self - noted changes of the Spine per CT. W/ support and tray setup/positioning, pt appears to be able to consume a semi-modified diet w/out immediate, overt clinical s/s of aspiration noted. Pt consumed boluses of thin liquids, purees, and softened solids broken down and moistened during her Lunch meal today without s/s of aspiration -- no overt coughing or decline in respiratory effort during/post trials noted. Vocal quality remained clear b/t trials when assessed. During the oral phase, pt exhibited adequate bolus management and A-P transfer for swallowing w/ trials; oral clearing achieved given time w/ the increased textured foods (meats chopped) d/t her Edentulous status baseline. Pt required min cues intermittently; she is HOH.  Recommend Dysphagia 3 diet w/ MINCED meats(level 2), Gravy and Thin liquids; Pills WHOLE in Puree for safer, easier swallowing as needed; aspiration precautions; support at  meals. Recommend dietician f/u for nutritional support.     HPI HPI: Pt is a 83 year old with past medical history of diastolic heart failure, cardiomegaly, hypertension and COPD on chronic 2 L nasal cannula admitted on 11/19 after being brought in by her son for increased confusion, weakness.  Apparently, patient has been going downhill for the past 6 months but more so in the past few days.  According to the patient's son, she had been urinating much more frequently so she was evaluated by her primary care doctor and found to have no evidence of urinary tract infection.  They recommended that she stop her Lasix.  Few days later, patient son states that her legs have become more swollen and having weeping edema.  Per MD notes, Acute on chronic respiratory failure with hypercarbia and hypoxia from COPD exacerbation and acute on chronic diastolic heart failure: Suspect that patient easily gets hypercarbic.  While she is somewhat compensated, she at times has significant oxygen desaturations.  Would prefer to keep her close to her baseline giving only oxygen for persistent episodes.  She is not apneic.  A big contributing factor is likely her heart failure; Lasix has been restarted, and pt is being monitored. Per chest CT: "patchy opacities in the right lower lobe with dependent atelectasis and a small right trace left pleural effusion; Extensive severe Emphysema; Progressive exaggeration of the thoracic kyphosis a background of scoliotic curvature and increased AP diameter of the chest; New compression deformities at T8, T9 and T10; Several areas of thinning and sclerotic change of the ribs". Head CT: No acute intracranial abnormality; Chronic microvascular angiopathy and parenchymal volume loss.       SLP Plan  All goals met       Recommendations  Diet recommendations: Dysphagia 3 (mechanical soft);Dysphagia 2 (fine chop);Thin liquid Liquids provided via: Cup;Straw(monitor) Medication Administration:  Whole meds with puree(for safer swallowing) Supervision: Patient able to self feed;Intermittent supervision to cue for compensatory strategies Compensations: Minimize environmental distractions;Slow rate;Small sips/bites;Lingual sweep for clearance of pocketing;Multiple dry swallows after each bite/sip;Follow solids with liquid Postural Changes and/or Swallow Maneuvers: Seated upright 90 degrees;Upright 30-60 min after meal                General recommendations: (Dietician f/u) Oral Care Recommendations: Oral care BID;Staff/trained caregiver to provide oral care(support) Follow up Recommendations: None SLP Visit Diagnosis: Dysphagia, oral phase (R13.11)(Edentulous status) Plan: All goals met       GO                 Orinda Kenner, MS, CCC-SLP Watson,Katherine 01/14/2019, 3:36 PM

## 2019-01-14 NOTE — NC FL2 (Signed)
[place] MEDICAID FL2 LEVEL OF CARE SCREENING TOOL     IDENTIFICATION  Patient Name: Brenda Higgins Birthdate: March 30, 1935 Sex: female Admission Date (Current Location): 01/10/2019  [place] and [place] Number:  Chiropodist and Address:  [facility], [address] [place], [place] [address]      Provider Number: [id number]  Attending Physician Name and Address:  Hollice Espy, MD  Relative Name and Phone Number:  Tinnie Gens son [phone]    Current Level of Care: Hospital Recommended Level of Care: Skilled Nursing Facility Prior Approval Number:    Date Approved/Denied:   PASRR Number: [id number]  Discharge Plan: SNF    Current Diagnoses: Patient Active Problem List   Diagnosis Date Noted  . Overweight (BMI 25.0-29.9) 01/11/2019  . Weakness 01/10/2019  . COPD exacerbation (HCC) 09/08/2014  . Acute on chronic respiratory failure with hypoxia and hypercapnia (HCC) 09/08/2014  . Acute on chronic diastolic CHF (congestive heart failure), NYHA class 1 (HCC) 09/08/2014  . Diastolic CHF (HCC) 09/08/2014  . Hip fracture (HCC) 09/04/2014    Orientation RESPIRATION BLADDER Height & Weight     Self, Place  O2(2 liters) Incontinent Weight: 34.9 kg Height:  4\' 6"  (137.2 cm)  BEHAVIORAL SYMPTOMS/MOOD NEUROLOGICAL BOWEL NUTRITION STATUS      Incontinent Diet(Mechamnical minced meats)  AMBULATORY STATUS COMMUNICATION OF NEEDS Skin   Extensive Assist Verbally Normal                       Personal Care Assistance Level of Assistance              Functional Limitations Info             SPECIAL CARE FACTORS FREQUENCY  PT (By licensed PT)     PT Frequency: 5 times per week              Contractures Contractures Info: Not present    Additional Factors Info  Code Status, Allergies Code Status Info: DNR Allergies Info: Levoflaxin, Amoxicillan           Current Medications (01/14/2019):  This is the current  hospital active medication list Current Facility-Administered Medications  Medication Dose Route Frequency Provider Last Rate Last Dose  . acetaminophen (TYLENOL) tablet 650 mg  650 mg Oral Q6H PRN 01/16/2019, MD   650 mg at 01/12/19 1054  . ALPRAZolam 01/14/19) tablet 0.25 mg  0.25 mg Oral Q6H PRN Prudy Feeler, MD   0.25 mg at 01/13/19 2109  . aspirin EC tablet 81 mg  81 mg Oral Daily Tu, Ching T, DO   81 mg at 01/14/19 01/16/19  . docusate sodium (COLACE) capsule 100 mg  100 mg Oral Daily Tu, Ching T, DO   100 mg at 01/14/19 01/16/19  . enoxaparin (LOVENOX) injection 30 mg  30 mg Subcutaneous Q24H Hallaji, Sheema M, RPH   30 mg at 01/13/19 2114  . feeding supplement (ENSURE ENLIVE) (ENSURE ENLIVE) liquid 237 mL  237 mL Oral BID BM 2115 K, MD      . furosemide (LASIX) injection 20 mg  20 mg Intravenous Q12H Virginia Rochester, MD   20 mg at 01/14/19 0918  . ipratropium-albuterol (DUONEB) 0.5-2.5 (3) MG/3ML nebulizer solution 3 mL  3 mL Nebulization Q6H PRN 01/16/19, MD      . polyethylene glycol (MIRALAX / GLYCOLAX) packet 17 g  17 g Oral Daily Tu, Ching T, DO  17 g at 01/14/19 0920  . pravastatin (PRAVACHOL) tablet 10 mg  10 mg Oral q1800 Tu, Ching T, DO      . umeclidinium-vilanterol (ANORO ELLIPTA) 62.5-25 MCG/INH 1 puff  1 puff Inhalation Daily Tu, Ching T, DO   1 puff at 01/14/19 0918     Discharge Medications: Please see discharge summary for a list of discharge medications.  Relevant Imaging Results:  Relevant Lab Results:   Additional Information SS# [id number]  Su Hilt, RN

## 2019-01-14 NOTE — Care Management Important Message (Signed)
Important Message  Patient Details  Name: Brenda Higgins MRN: [id number] Date of Birth: Mar 31, 1935   Medicare Important Message Given:  Yes     [name] 01/14/2019, 11:36 AM

## 2019-01-14 NOTE — TOC Progression Note (Signed)
Transition of Care [facility]) - Progression Note    Patient Details  Name: Brenda Higgins MRN: [id number] Date of Birth: 1935-05-26  Transition of Care [facility]) CM/SW Contact  Su Hilt, RN Phone Number: 01/14/2019, 12:29 PM  Clinical Narrative:     Phoebe Perch, PAssr and bed search completed, will review bed offers once obtained      Expected Discharge Plan and Services                                                 Social Determinants of Health (SDOH) Interventions    Readmission Risk Interventions No flowsheet data found.

## 2019-01-15 MED ORDER — ENSURE ENLIVE PO LIQD: 237.0000 mL | Freq: Two times a day (BID) | ORAL | 12 refills | Status: AC

## 2019-01-15 MED ORDER — IPRATROPIUM-ALBUTEROL 0.5-2.5 (3) MG/3ML IN SOLN: 3.0000 mL | Freq: Four times a day (QID) | RESPIRATORY_TRACT | 0 refills | Status: AC | PRN

## 2019-01-15 MED ORDER — ALPRAZOLAM 0.25 MG PO TABS: 0.2500 mg | ORAL_TABLET | Freq: Four times a day (QID) | ORAL | 0 refills | Status: AC | PRN

## 2019-01-15 MED ORDER — POLYETHYLENE GLYCOL 3350 17 G PO PACK: 17.0000 g | PACK | Freq: Every day | ORAL | 0 refills | Status: AC | PRN

## 2019-01-15 NOTE — Progress Notes (Signed)
Physical Therapy Treatment Patient Details Name: SMT LOKEY MRN: [id number] DOB: 1935/11/05 Today's Date: 01/15/2019    History of Present Illness Pt is 83 y.o. female with medical history significant of diastolic congestive heart failure, hypertension, COPD with chronic hypoxemia requiring 2 L who presents for concerns of increased weakness and leg pain.    PT Comments    Patient in bed, easily woken to touch. Pt extremely HOH, oriented to self and place, most successful with gestures and 2-3 words very close to ear. The patient was able to mobilize to EOB with supervision and HOB elevated. Sat EOB for 1-2 minutes to assess spO2, WFLs. Sit <> Stand with RW and minA, improved from previous session and able to take several steps to recliner in room with minA. Pt exhibited fatigue and mild desaturation to low 90s, some SOB noted. The patient was able to complete some seated exercises with demonstration. Pt repositioned for breakfast, eager to eat. The patient demonstrated improvement towards goals and would benefit from further skilled PT intervention to return to PLOF.      Follow Up Recommendations  SNF     Equipment Recommendations  None recommended by PT    Recommendations for Other Services       Precautions / Restrictions Precautions Precautions: Fall Restrictions Weight Bearing Restrictions: No    Mobility  Bed Mobility Overal bed mobility: Needs Assistance Bed Mobility: Supine to Sit     Supine to sit: Supervision;HOB elevated        Transfers Overall transfer level: Needs assistance Equipment used: Rolling walker (2 wheeled) Transfers: Sit to/from Stand Sit to Stand: Min assist         General transfer comment: Pt with improved physical ability to participate in transfer  Ambulation/Gait Ambulation/Gait assistance: Min assist Gait Distance (Feet): 3 Feet Assistive device: Rolling walker (2 wheeled)       General Gait Details: few shuffled steps to  recliner in the room   Stairs             Wheelchair Mobility    Modified Rankin (Stroke Patients Only)       Balance Overall balance assessment: Needs assistance Sitting-balance support: Bilateral upper extremity supported Sitting balance-Leahy Scale: Fair       Standing balance-Leahy Scale: Poor                              Cognition Arousal/Alertness: Awake/alert Behavior During Therapy: WFL for tasks assessed/performed Overall Cognitive Status: Difficult to assess                                 General Comments: pt oriented to self and location      Exercises General Exercises - Lower Extremity Ankle Circles/Pumps: AROM;Both;Seated;10 reps Long Arc Quad: AROM;Strengthening;Both;10 reps Hip Flexion/Marching: AROM;Strengthening;Both;10 reps    General Comments        Pertinent Vitals/Pain Pain Assessment: No/denies pain    Home Living                      Prior Function            PT Goals (current goals can now be found in the care plan section) Progress towards PT goals: Progressing toward goals    Frequency    Min 2X/week      PT Plan Current plan remains appropriate  Co-evaluation              AM-PAC PT "6 Clicks" Mobility   Outcome Measure  Help needed turning from your back to your side while in a flat bed without using bedrails?: A Little Help needed moving from lying on your back to sitting on the side of a flat bed without using bedrails?: A Little Help needed moving to and from a bed to a chair (including a wheelchair)?: A Little Help needed standing up from a chair using your arms (e.g., wheelchair or bedside chair)?: A Little Help needed to walk in hospital room?: A Lot Help needed climbing 3-5 steps with a railing? : Total 6 Click Score: 15    End of Session Equipment Utilized During Treatment: Gait belt Activity Tolerance: Patient tolerated treatment well Patient left: with  call bell/phone within reach;in chair;with chair alarm set Nurse Communication: Mobility status PT Visit Diagnosis: Other abnormalities of gait and mobility (R26.89);Difficulty in walking, not elsewhere classified (R26.2);Muscle weakness (generalized) (M62.81);History of falling (Z91.81)     Time: 1003-1020 PT Time Calculation (min) (ACUTE ONLY): 17 min  Charges:  $Therapeutic Exercise: 8-22 mins                     Lieutenant Diego PT, DPT 10:34 AM,01/15/19 [phone]

## 2019-01-15 NOTE — Discharge Summary (Signed)
Discharge Summary  Brenda Higgins LNL:[id number] DOB: June 01, 1935  PCP: System, Pcp Not In  Admit date: 01/10/2019 Discharge date: 01/15/2019  Time spent: 25 minutes  Recommendations for Outpatient Follow-up:  1. New medication: As needed Xanax every 6 hours for agitation anxiety 2. Patient being discharged to short-term skilled nursing  Discharge Diagnoses:  Active Hospital Problems   Diagnosis Date Noted   Acute on chronic respiratory failure with hypoxia and hypercapnia (HCC) 09/08/2014   Protein-calorie malnutrition, severe 01/14/2019   Weakness 01/10/2019   COPD exacerbation ([place]) 09/08/2014   Acute on chronic diastolic CHF (congestive heart failure), NYHA class 1 ([place]) 09/08/2014    Resolved Hospital Problems  No resolved problems to display.    Discharge Condition: Improved, being discharged to short-term skilled nursing  Diet recommendation: Dysphagia 3 diet with thin liquids.  Please MINCE ALL MEATS and add Gravy; keep chopping up tougher foods. Send SWEET Tea and lemon juice on trays. Soups at lunch/dinner meals.   Vitals:   01/14/19 2347 01/15/19 0703  BP: 117/60 (!) 106/59  Pulse: 62 66  Resp: 17 17  Temp: 97.7 F (36.5 C) (!) 97.5 F (36.4 C)  SpO2: 95% 100%    History of present illness:  83 year old with past medical history of diastolic heart failure, hypertension and COPD on chronic 2 L nasal cannula admitted on 11/19 after being brought in by her son for increased confusion, weakness.  Apparently, patient has been going downhill for the past 6 months but more so in the past few days.  According to the patient's son, she had been urinating much more frequently so she was evaluated by her primary care doctor and found to have no evidence of urinary tract infection.  They recommended that she stop her Lasix.  Few days later, patient son states that her legs have become more swollen and having weeping edema.  In the emergency room, patient had to be  afebrile and normotensive.  Initially she was on her oxygen of 2 L nasal cannula.  Hemoglobin noted anemia, but chronic.  Abnormal labs were noteworthy for a BNP of 384.  CT scan of chest noted diffusely demineralized appearance of bones and ribs that can be seen in hyperparathyroidism.  Patchy opacities were noted concerning for pneumonia or aspiration.  Patient was given a dose of IV Rocephin for suspected leg cellulitis, although the admitting physician did not think that she had that.  Procalcitonin level also normal.  Hospital Course:  Acute on chronic respiratory failure with hypercarbia and hypoxia from COPD exacerbation and acute on chronic diastolic heart failure: Suspect that patient easily gets hypercarbic. Following admission, patient had episodes of oxygen desaturation and an ABG noted marked hypercarbia and hypoxia.  However, respiratory suspected the sample may be incorrect and a repeat ABG noted hypoxia and hypercarbia, but compensated with a stable pH.  By following morning, patient more awake and appears to be at her baseline, as confirmed by her son.  Seen by physical therapy who are recommending short-term skilled nursing for which her son agrees.  While she is somewhat compensated, she at times has significant oxygen desaturations.  Would prefer to keep her close to her baseline giving only oxygen for persistent episodes.  She is not apneic.  A big contributing factor is likely her heart failure.  Echocardiogram notes preserved ejection fraction and no comment on diastolic dysfunction.  She is down over 2.5 L of fluid and over 3 pounds since admission.  Despite infiltrate seen on  CT, procalcitonin level normal.  No reason for antibiotics.  Severe protein calorie malnutrition/failure to thrive: Seen by nutrition.  Patient meets criteria for severe malnutrition is related to her chronic illnesses of COPD and CHF as evidenced by severe fat and muscle depletion.  On Ensure Enlive twice  daily plus multivitamin.    Dysphagia: Speech therapy evaluated patient recommending dysphagia 3 diet with minced meats and pills in pure.  Demineralization: Seen on CT.  Patient does not have hypercalcemia.  PTH level normal.  Consultants:  None  Procedures:  Echocardiogram preserved ejection fraction.  Moderately dilated left and right atrium.  Mild to moderate tricuspid regurg with mild mitral regurg.  No comment on diastolic dysfunction.  Discharge Exam: BP (!) 106/59 (BP Location: Left Arm)    Pulse 66    Temp (!) 97.5 F (36.4 C) (Oral)    Resp 17    Ht 4\' 6"  (1.372 m)    Wt 34.9 kg    SpO2 100%    BMI 18.55 kg/m   General: Awake, oriented x2, no acute distress Cardiovascular: Regular rate and rhythm, S1-S2, 2 out of 6 systolic ejection murmur Respiratory: Decreased breath sounds throughout  Discharge Instructions You were cared for by a hospitalist during your hospital stay. If you have any questions about your discharge medications or the care you received while you were in the hospital after you are discharged, you can call the unit and asked to speak with the hospitalist on call if the hospitalist that took care of you is not available. Once you are discharged, your primary care physician will handle any further medical issues. Please note that NO REFILLS for any discharge medications will be authorized once you are discharged, as it is imperative that you return to your primary care physician (or establish a relationship with a primary care physician if you do not have one) for your aftercare needs so that they can reassess your need for medications and monitor your lab values.  Discharge Instructions    DIET DYS 3   Complete by: As directed    Please MINCE ALL MEATS and add Gravy; keep chopping up tougher foods. Send SWEET Tea and lemon juice on trays. Soups at lunch/dinner meals.   Fluid consistency: Thin   Increase activity slowly   Complete by: As directed       Allergies as of 01/15/2019      Reactions   Levofloxacin Shortness Of Breath, Rash   Amoxicillin-pot Clavulanate Diarrhea      Medication List    TAKE these medications   ALPRAZolam 0.25 MG tablet Commonly known as: XANAX Take 1 tablet (0.25 mg total) by mouth every 6 (six) hours as needed for anxiety.   amLODipine 5 MG tablet Commonly known as: NORVASC Take 5 mg by mouth daily.   Anoro Ellipta 62.5-25 MCG/INH Aepb Generic drug: umeclidinium-vilanterol INHALE 1 INHALATION INTO THE LUNGS ONCE DAILY. DO NOT USE WITH SPIRIVA OR ADVAIR   aspirin EC 81 MG tablet Take 81 mg by mouth daily.   feeding supplement (ENSURE ENLIVE) Liqd Take 237 mLs by mouth 2 (two) times daily between meals.   furosemide 20 MG tablet Commonly known as: LASIX Take 1 tablet (20 mg total) by mouth daily.   hydrochlorothiazide 12.5 MG capsule Commonly known as: MICROZIDE Take 12.5 mg by mouth daily.   ipratropium-albuterol 0.5-2.5 (3) MG/3ML Soln Commonly known as: DuoNeb Take 3 mLs by nebulization every 6 (six) hours as needed (Wheezing, Shortness of breath). What  changed:   when to take this  reasons to take this   lovastatin 40 MG tablet Commonly known as: MEVACOR Take 40 mg by mouth daily.   polyethylene glycol 17 g packet Commonly known as: MIRALAX / GLYCOLAX Take 17 g by mouth daily as needed for mild constipation.   Proventil HFA 108 (90 Base) MCG/ACT inhaler Generic drug: albuterol Inhale into the lungs.      Allergies  Allergen Reactions   Levofloxacin Shortness Of Breath and Rash   Amoxicillin-Pot Clavulanate Diarrhea   Follow-up Information    [facility] Follow up on 01/28/2019.   Specialty: Cardiology Why: at 9:00am. Enter through the [facility] entrance Contact information: [address] [place] [address] [phone]           The results of significant diagnostics from this  hospitalization (including imaging, microbiology, ancillary and laboratory) are listed below for reference.    Significant Diagnostic Studies: Dg Tibia/fibula Left  Result Date: 01/10/2019 CLINICAL DATA:  Fall, weeping edema to the left leg EXAM: LEFT TIBIA AND FIBULA - 2 VIEW COMPARISON:  None. FINDINGS: The osseous structures appear diffusely demineralized which may limit detection of small or nondisplaced fractures. No acute bony abnormality. Specifically, no fracture, subluxation, or dislocation. Fairly well-defined sclerotic/lucent lesion in the lateral tibial plateau with a relatively narrow zone of transition. Diffuse edematous soft tissue swelling of the leg. Vascular calcium noted posteriorly. Mild arthrosis in the knee and ankle. IMPRESSION: No acute osseous abnormality. Benign appearing sclerotic/lucent lesion in the lateral tibial plateau. Mild degenerative changes of the knee and ankle. Diffuse lower leg edema. Electronically Signed   By: Kreg Shropshire M.D.   On: 01/10/2019 15:23   Ct Head Wo Contrast  Result Date: 01/10/2019 CLINICAL DATA:  Fall, weakness EXAM: CT HEAD WITHOUT CONTRAST TECHNIQUE: Contiguous axial images were obtained from the base of the skull through the vertex without intravenous contrast. COMPARISON:  None. FINDINGS: Brain: Initial imaging resulted in motion artifact at the skull base. Images were reacquired. No evidence of acute infarction, hemorrhage, hydrocephalus, extra-axial collection or mass lesion/mass effect. Symmetric prominence of the ventricles, cisterns and sulci compatible with parenchymal volume loss. Patchy areas of white matter hypoattenuation are most compatible with chronic microvascular angiopathy. Vascular: Atherosclerotic calcification of the carotid siphons. No hyperdense vessel. Skull: Diffuse bony demineralization. 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 No calvarial fracture. No scalp swelling or edema. Sinuses/Orbits: Paranasal sinuses and mastoid air cells are predominantly clear. Included orbital structures are unremarkable. Other: None IMPRESSION: 1. No acute intracranial abnormality. 2. Chronic microvascular angiopathy and parenchymal volume loss. 3. Spotty demineralization of the skull has an appearance suggestive of hyperparathyroidism. Electronically Signed   By: Kreg Shropshire M.D.   On: 01/10/2019 16:21   Ct Chest Wo Contrast  Result Date: 01/10/2019 CLINICAL DATA:  Fall, evaluate for rib fractures EXAM: CT CHEST WITHOUT CONTRAST TECHNIQUE: Multidetector CT imaging of the chest was performed following the standard protocol without IV contrast. COMPARISON:  CT chest 04/23/2012 FINDINGS: Cardiovascular: Cardiomegaly. Extensive three-vessel coronary artery disease is noted. Calcifications present on the aortic leaflets. The thoracic aorta is heavily calcified. Extensive calcifications seen throughout the proximal great vessels. Central pulmonary arteries are enlarged. Luminal evaluation precluded in the absence of contrast media. Mediastinum/Nodes: No enlarged mediastinal or axillary adenopathy. Diminutive thyroid gland. No acute abnormality of the esophagus.  Coronal narrowing of the trachea with scattered secretions in the central airways. Lungs/Pleura: Extensive severe centrilobular and paraseptal emphysematous changes are noted with more hazy opacity in the lungs with some fissural and septal thickening. More patchy opacities present in the right lower lobe with dependent atelectasis and a small right trace left pleural effusion. Upper Abdomen: No acute abnormalities present in the visualized portions of the upper abdomen. Musculoskeletal: The osseous structures appear diffusely demineralized which may limit detection of small or nondisplaced fractures. In comparison to prior studies there is been significant increase in the degree of kyphotic  curvature of the upper thoracic spine most pronounced around the T6-T7 levels. There has been interval development of compression deformities at T8 (20% height loss), T9 (60% height loss), and T10 (50% height loss). There is S-shaped scoliotic curvature of the thoracic and lumbar spine, dextrocurvature apex at T5 and levocurvature apex at T12. Increased AP diameter of the chest. Several remote posttraumatic deformities of the ribs are noted including the right second, fourth fifth and sixth ribs laterally. Several areas of thinning and sclerotic change of the ribs concerning for rib notching are noted involving the right third through sixth rib posteriorly and the left third rib posteriorly. IMPRESSION: 1. Diffusely demineralized appearance of the bones may limit detection of subtle, nondisplaced fractures. 2. New compression deformities at T8, T9 and T10. 3. Progressive exaggeration of the thoracic kyphosis a background of scoliotic curvature and increased AP diameter of the chest. 4. Several areas of thinning and sclerotic change of the ribs concerning for rib notching are noted involving the right third through sixth rib posteriorly and the left third rib posteriorly. Such findings are nonspecific but could be seen with a resorptive process including hyperparathyroidism. 5. Cardiomegaly with extensive three-vessel coronary artery disease. 6. Central pulmonary arteries are enlarged, which can be seen with pulmonary arterial hypertension. 7. Extensive severe Emphysema (ICD10-J43.9). 8. More patchy opacities in the right lower lobe with dependent atelectasis and a small right trace left pleural effusion, concerning for superimposed pneumonia or aspiration particularly given the scattered secretions within the airways. 9. Coronal narrowing of the trachea, a saber sheath deformity which is typically considered pathognomonic of COPD. 10. Aortic Atherosclerosis (ICD10-I70.0). Electronically Signed   By: Kreg ShropshirePrice  DeHay  M.D.   On: 01/10/2019 16:18   [place] Venous Img Lower Unilateral Left  Result Date: 01/10/2019 CLINICAL DATA:  Larey SeatFell, weeping edema EXAM: LEFT LOWER EXTREMITY VENOUS DOPPLER ULTRASOUND TECHNIQUE: Gray-scale sonography with compression, as well as color and duplex ultrasound, were performed to evaluate the deep venous system from the level of the common femoral vein through the popliteal and proximal calf veins. COMPARISON:  09/04/2014 FINDINGS: Normal compressibility of the common femoral, superficial femoral, and popliteal veins, as well as the proximal calf veins. No filling defects to suggest DVT on grayscale or color Doppler imaging. Doppler waveforms show normal direction of venous flow, normal respiratory phasicity and response to augmentation. Subcutaneous edema in the calf. Survey views of the contralateral common femoral vein are unremarkable. IMPRESSION: No femoropopliteal and no calf DVT in the visualized calf veins. If clinical symptoms are inconsistent or if there are persistent or worsening symptoms, further imaging (possibly involving the iliac veins) may be warranted. Electronically Signed   By: Corlis Leak  Hassell M.D.   On: 01/10/2019 15:50    Microbiology: Recent Results (from the past 240 hour(s))  SARS CORONAVIRUS 2 (TAT 6-24 HRS) Nasopharyngeal Nasopharyngeal Swab     Status: None   Collection Time: 01/10/19  9:38 PM   Specimen: Nasopharyngeal Swab  Result Value Ref Range Status   SARS Coronavirus 2 NEGATIVE NEGATIVE Final    Comment: (NOTE) SARS-CoV-2 target nucleic acids are NOT DETECTED. The SARS-CoV-2 RNA is generally detectable in upper and lower respiratory specimens during the acute phase of infection. Negative results do not preclude SARS-CoV-2 infection, do not rule out co-infections with other pathogens, and should not be used as the sole basis for treatment or other patient management decisions. Negative results must be combined with clinical observations, patient history,  and epidemiological information. The expected result is Negative. Fact Sheet for Patients: HairSlick.no Fact Sheet for Healthcare Providers: quierodirigir.com This test is not yet approved or cleared by the [place] FDA and  has been authorized for detection and/or diagnosis of SARS-CoV-2 by FDA under an Emergency Use Authorization (EUA). This EUA will remain  in effect (meaning this test can be used) for the duration of the COVID-19 declaration under Section 56 4(b)(1) of the Act, 21 U.S.C. section 360bbb-3(b)(1), unless the authorization is terminated or revoked sooner. Performed at [facility], [address], [place], [place] [address]   SARS CORONAVIRUS 2 (TAT 6-24 HRS) Nasopharyngeal Nasopharyngeal Swab     Status: None   Collection Time: 01/13/19  5:40 AM   Specimen: Nasopharyngeal Swab  Result Value Ref Range Status   SARS Coronavirus 2 NEGATIVE NEGATIVE Final    Comment: (NOTE) SARS-CoV-2 target nucleic acids are NOT DETECTED. The SARS-CoV-2 RNA is generally detectable in upper and lower respiratory specimens during the acute phase of infection. Negative results do not preclude SARS-CoV-2 infection, do not rule out co-infections with other pathogens, and should not be used as the sole basis for treatment or other patient management decisions. Negative results must be combined with clinical observations, patient history, and epidemiological information. The expected result is Negative. Fact Sheet for Patients: HairSlick.no Fact Sheet for Healthcare Providers: quierodirigir.com This test is not yet approved or cleared by the [place] FDA and  has been authorized for detection and/or diagnosis of SARS-CoV-2 by FDA under an Emergency Use Authorization (EUA). This EUA will remain  in effect (meaning this test can be used) for the duration of  the COVID-19 declaration under Section 56 4(b)(1) of the Act, 21 U.S.C. section 360bbb-3(b)(1), unless the authorization is terminated or revoked sooner. Performed at [facility], [address], [place], [place] [address]      Labs: Basic Metabolic Panel: Recent Labs  Lab 01/10/19 1447 01/11/19 0521 01/12/19 0427 01/13/19 0606  NA 139 143 141 139  K 3.6 3.5 3.5 3.8  CL 86* 89* 87* 88*  CO2 38* 43* 41* 40*  GLUCOSE 125* 91 70 89  BUN 36* 27* 22 20  CREATININE 0.54 0.43* 0.41* 0.46  CALCIUM 8.6* 8.3*   8.5* 8.7* 9.0   Liver Function Tests: No results for input(s): AST, ALT, ALKPHOS, BILITOT, PROT, ALBUMIN in the last 168 hours. No results for input(s): LIPASE, AMYLASE in the last 168 hours. No results for input(s): AMMONIA in the last 168 hours. CBC: Recent Labs  Lab 01/10/19 1447 01/11/19 0521 01/12/19 0427 01/13/19 0606  WBC 7.8 7.2 8.7 8.4  NEUTROABS 6.5  --   --   --   HGB 9.4* 8.7* 9.5* 10.2*  HCT 33.4* 30.1* 34.0* 34.4*  MCV 87.0 83.6 86.1 80.9  PLT 296 279 304 311   Cardiac Enzymes: No results for input(s): CKTOTAL, CKMB, CKMBINDEX, TROPONINI in the last 168 hours.  BNP: BNP (last 3 results) Recent Labs    01/10/19 1447  BNP 384.0*    ProBNP (last 3 results) No results for input(s): PROBNP in the last 8760 hours.  CBG: No results for input(s): GLUCAP in the last 168 hours.     Signed:  Hollice Espy, MD [facility] 01/15/2019, 1:38 PM

## 2019-01-15 NOTE — TOC Transition Note (Signed)
Transition of Care [facility]) - CM/SW Discharge Note   Patient Details  Name: Brenda Higgins MRN: [id number] Date of Birth: [birth year]  [facility]) CM/SW Contact:  Weston Anna, LCSW Phone Number: 01/15/2019, 2:54 PM   Clinical Narrative:     Patient set to discharge to [facility] today-please call report to [phone]. Son, Brenda Higgins, notified and has no concerns. EMS called for transportation. All needed forms signed by MD.        Patient Goals and CMS Choice        Discharge Placement                       Discharge Plan and Services                                     Social Determinants of Health (SDOH) Interventions     Readmission Risk Interventions No flowsheet data found.

## 2019-01-15 NOTE — Progress Notes (Signed)
Pt d/c to [facility] via EMS. VSS. Report sent to RN at [place]. IV removed intact.

## 2019-01-26 NOTE — Progress Notes (Signed)
Patient ID: Brenda Higgins, female    DOB: 08-29-1935, 83 y.o.   MRN: [id number]  HPI  Brenda Higgins is a 83 y/o female with a history of hyperlipidemia, HTN, COPD, tobacco use, extremely hard of hearing and chronic heart failure.   Echo report from 01/12/2019 reviewed and showed an EF of 60-65% along with mild MR, mild/moderate TR and moderately elevated PA pressure of 46.6 mmHg.   Admitted 01/10/2019 due to COPD/HF exacerbation. Speech and PT evaluated patient. Discharged after 5 days to SNF.   She presents today for her initial visit with a chief complaint of moderate shortness of breath upon minimal exertion. She describes this as chronic in nature having been present for several years. She has associated fatigue, hearing loss, dizziness and difficulty sleeping along with this. She denies any abdominal distention, palpitations, pedal edema, chest pain or cough.   Says that she's getting weighed at the facility but doesn't think it's every day. Patient is extremely hard of hearing making it difficult to get a good history.   Past Medical History:  Diagnosis Date  . CHF (congestive heart failure) (HCC)   . Chronic respiratory failure (HCC)   . COPD (chronic obstructive pulmonary disease) (HCC)   . Hard of hearing   . Hyperlipidemia   . Hypertension    Past Surgical History:  Procedure Laterality Date  . HIP SURGERY Right   . INTRAMEDULLARY (IM) NAIL INTERTROCHANTERIC Left 09/05/2014   Procedure: INTRAMEDULLARY (IM) NAIL INTERTROCHANTRIC;  Surgeon: Kennedy Bucker, MD;  Location: [facility] ORS;  Service: Orthopedics;  Laterality: Left;   Family History  Problem Relation Age of Onset  . Coronary artery disease Father        And mother   Social History   Tobacco Use  . Smoking status: Current Every Day Smoker    Packs/day: 0.50    Types: Cigarettes  . Smokeless tobacco: Never Used  Substance Use Topics  . Alcohol use: No   Allergies  Allergen Reactions  . Levofloxacin Shortness Of  Breath and Rash  . Amoxicillin-Pot Clavulanate Diarrhea   Prior to Admission medications   Medication Sig Start Date End Date Taking? Authorizing Provider  albuterol (PROVENTIL HFA) 108 (90 Base) MCG/ACT inhaler Inhale into the lungs.   Yes [provider]  ALPRAZolam (XANAX) 0.25 MG tablet Take 1 tablet (0.25 mg total) by mouth every 6 (six) hours as needed for anxiety. 01/15/19  Yes Hollice Espy, MD  amLODipine (NORVASC) 5 MG tablet Take 5 mg by mouth daily.   Yes [provider]  ANORO ELLIPTA 62.5-25 MCG/INH AEPB INHALE 1 INHALATION INTO THE LUNGS ONCE DAILY. DO NOT USE WITH SPIRIVA OR ADVAIR 05/06/17  Yes [provider]  aspirin EC 81 MG tablet Take 81 mg by mouth daily.   Yes [provider]  feeding supplement, ENSURE ENLIVE, (ENSURE ENLIVE) LIQD Take 237 mLs by mouth 2 (two) times daily between meals. 01/15/19  Yes Hollice Espy, MD  furosemide (LASIX) 20 MG tablet Take 1 tablet (20 mg total) by mouth daily. 09/08/14  Yes Sudini, Wardell Heath, MD  hydrochlorothiazide (MICROZIDE) 12.5 MG capsule Take 12.5 mg by mouth daily.  02/24/17  Yes [provider]  ipratropium-albuterol (DUONEB) 0.5-2.5 (3) MG/3ML SOLN Take 3 mLs by nebulization every 6 (six) hours as needed (Wheezing, Shortness of breath). 01/15/19  Yes Hollice Espy, MD  lovastatin (MEVACOR) 40 MG tablet Take 40 mg by mouth daily.   Yes [provider]  polyethylene  glycol (MIRALAX / GLYCOLAX) 17 g packet Take 17 g by mouth daily as needed for mild constipation. 01/15/19  Yes Hollice Espy, MD    Review of Systems  Constitutional: Positive for fatigue. Negative for appetite change.  HENT: Positive for hearing loss. Negative for congestion and sore throat.   Eyes: Negative.   Respiratory: Positive for shortness of breath. Negative for cough.   Cardiovascular: Negative for chest pain, palpitations and leg swelling.  Gastrointestinal: Negative for abdominal  distention and abdominal pain.  Endocrine: Negative.   Genitourinary: Negative.   Musculoskeletal: Negative for back pain and neck pain.  Skin: Negative.   Allergic/Immunologic: Negative.   Neurological: Positive for dizziness. Negative for light-headedness.  Hematological: Negative for adenopathy. Does not bruise/bleed easily.  Psychiatric/Behavioral: Positive for sleep disturbance. Negative for dysphoric mood. The patient is nervous/anxious.     Vitals:   01/28/19 0902  BP: 133/64  Pulse: 86  Resp: 15  SpO2: 92%  Weight: 80 lb (36.3 kg)  Height: 4\' 6"  (1.372 m)  PF: (!) 3 L/min   Wt Readings from Last 3 Encounters:  01/28/19 80 lb (36.3 kg)  01/14/19 76 lb 15.1 oz (34.9 kg)  05/19/17 95 lb (43.1 kg)   Lab Results  Component Value Date   CREATININE 0.46 01/13/2019   CREATININE 0.41 (L) 01/12/2019   CREATININE 0.43 (L) 01/11/2019     Physical Exam Vitals signs and nursing note reviewed.  Constitutional:      Appearance: She is well-developed.  HENT:     Right Ear: Decreased hearing noted.     Left Ear: Decreased hearing noted.  Neck:     Musculoskeletal: Normal range of motion.     Vascular: No JVD.  Cardiovascular:     Rate and Rhythm: Normal rate and regular rhythm.  Pulmonary:     Effort: Pulmonary effort is normal. No respiratory distress.     Breath sounds: No rhonchi or rales.  Abdominal:     Palpations: Abdomen is soft.     Tenderness: There is no abdominal tenderness.  Musculoskeletal:     Cervical back: She exhibits deformity (kyphosis present).     Right lower leg: She exhibits no tenderness. No edema.     Left lower leg: She exhibits no tenderness. No edema.  Skin:    General: Skin is warm and dry.  Neurological:     General: No focal deficit present.     Mental Status: She is alert and oriented to person, place, and time.  Psychiatric:        Mood and Affect: Mood is anxious.        Behavior: Behavior normal.    Assessment & Plan:  1:  Chronic heart failure with preserved ejection fraction- - NYHA class III - euvolemic today - getting weighed at the facility and weight chart was reviewed. Has not been weighed recently and it was written on her AVS to weigh daily and call for an overnight weight gain of >2 pounds or a weekly weight gain of >5 pounds - she says that she adds salt to her food and she was encouraged to not add any salt to her food - has not seen cardiology - thinks that she's received her flu vaccine for this season - BNP 01/10/2019 was 384.0  2: HTN- - BP looks good today - saw PCP 01/12/2019) 01/02/2019 & now seeing PCP at 13/12/2018 and Rehab at [facility] 01/13/2019 reviewed and showed sodium 139, potassium 3.8,  creatinine 0.46 and GFR >60  3: COPD-  - saw pulmonology Raul Del) 09/12/2018 - lungs are clear today but difficult to hear due to her kyphosis - wearing oxygen at 2-3L around the clock  Facility medication list was reviewed.   Patient looks good from HF standpoint, so will not make a return appointment for patient at this time. Facility can call us for above weight gain or fluid retention and we can schedule another appointment at that time.   Patient was relieved as she says that she gets very nervous when going to medical appointments.

## 2019-01-28 ENCOUNTER — Encounter: Payer: Self-pay | Admitting: Family

## 2019-01-28 ENCOUNTER — Ambulatory Visit: Payer: Medicare Other | Attending: Family | Admitting: Family

## 2019-01-28 ENCOUNTER — Other Ambulatory Visit: Payer: Self-pay

## 2019-01-28 VITALS — BP 133/64 | HR 86 | Resp 15 | Ht <= 58 in | Wt 80.0 lb

## 2019-01-28 DIAGNOSIS — J449 Chronic obstructive pulmonary disease, unspecified: Secondary | ICD-10-CM | POA: Diagnosis not present

## 2019-01-28 DIAGNOSIS — I110 Hypertensive heart disease with heart failure: Secondary | ICD-10-CM | POA: Insufficient documentation

## 2019-01-28 DIAGNOSIS — Z7982 Long term (current) use of aspirin: Secondary | ICD-10-CM | POA: Diagnosis not present

## 2019-01-28 DIAGNOSIS — I10 Essential (primary) hypertension: Secondary | ICD-10-CM

## 2019-01-28 DIAGNOSIS — I5032 Chronic diastolic (congestive) heart failure: Secondary | ICD-10-CM

## 2019-01-28 DIAGNOSIS — H9190 Unspecified hearing loss, unspecified ear: Secondary | ICD-10-CM | POA: Insufficient documentation

## 2019-01-28 DIAGNOSIS — Z8249 Family history of ischemic heart disease and other diseases of the circulatory system: Secondary | ICD-10-CM | POA: Diagnosis not present

## 2019-01-28 DIAGNOSIS — E785 Hyperlipidemia, unspecified: Secondary | ICD-10-CM | POA: Diagnosis not present

## 2019-01-28 DIAGNOSIS — Z880 Allergy status to penicillin: Secondary | ICD-10-CM | POA: Diagnosis not present

## 2019-01-28 DIAGNOSIS — Z79899 Other long term (current) drug therapy: Secondary | ICD-10-CM | POA: Insufficient documentation

## 2019-01-28 DIAGNOSIS — I509 Heart failure, unspecified: Secondary | ICD-10-CM | POA: Diagnosis present

## 2019-01-28 DIAGNOSIS — F17210 Nicotine dependence, cigarettes, uncomplicated: Secondary | ICD-10-CM | POA: Diagnosis not present

## 2019-01-28 NOTE — Patient Instructions (Addendum)
Continue weighing daily and call for an overnight weight gain of > 2 pounds or a weekly weight gain of >5 pounds.  Return if you need to for any questions or issues

## 2019-01-29 ENCOUNTER — Other Ambulatory Visit: Payer: Self-pay

## 2019-01-29 ENCOUNTER — Encounter: Payer: Self-pay | Admitting: Emergency Medicine

## 2019-01-29 ENCOUNTER — Emergency Department
Admission: EM | Admit: 2019-01-29 | Discharge: 2019-01-29 | Disposition: A | Discharge status: Home / Self Care | Payer: Medicare Other | Attending: Internal Medicine | Admitting: Internal Medicine

## 2019-01-29 ENCOUNTER — Emergency Department: Payer: Medicare Other

## 2019-01-29 DIAGNOSIS — I5032 Chronic diastolic (congestive) heart failure: Secondary | ICD-10-CM | POA: Insufficient documentation

## 2019-01-29 DIAGNOSIS — Z20828 Contact with and (suspected) exposure to other viral communicable diseases: Secondary | ICD-10-CM | POA: Insufficient documentation

## 2019-01-29 DIAGNOSIS — Y939 Activity, unspecified: Secondary | ICD-10-CM | POA: Insufficient documentation

## 2019-01-29 DIAGNOSIS — F17210 Nicotine dependence, cigarettes, uncomplicated: Secondary | ICD-10-CM | POA: Insufficient documentation

## 2019-01-29 DIAGNOSIS — I110 Hypertensive heart disease with heart failure: Secondary | ICD-10-CM | POA: Insufficient documentation

## 2019-01-29 DIAGNOSIS — S32512A Fracture of superior rim of left pubis, initial encounter for closed fracture: Secondary | ICD-10-CM | POA: Diagnosis not present

## 2019-01-29 DIAGNOSIS — M25552 Pain in left hip: Secondary | ICD-10-CM

## 2019-01-29 DIAGNOSIS — Y92129 Unspecified place in nursing home as the place of occurrence of the external cause: Secondary | ICD-10-CM | POA: Insufficient documentation

## 2019-01-29 DIAGNOSIS — W19XXXA Unspecified fall, initial encounter: Secondary | ICD-10-CM | POA: Diagnosis not present

## 2019-01-29 DIAGNOSIS — Y999 Unspecified external cause status: Secondary | ICD-10-CM | POA: Insufficient documentation

## 2019-01-29 DIAGNOSIS — J449 Chronic obstructive pulmonary disease, unspecified: Secondary | ICD-10-CM | POA: Diagnosis not present

## 2019-01-29 DIAGNOSIS — R52 Pain, unspecified: Secondary | ICD-10-CM

## 2019-01-29 DIAGNOSIS — S79919A Unspecified injury of unspecified hip, initial encounter: Secondary | ICD-10-CM | POA: Diagnosis present

## 2019-01-29 DIAGNOSIS — Z7982 Long term (current) use of aspirin: Secondary | ICD-10-CM | POA: Insufficient documentation

## 2019-01-29 DIAGNOSIS — Z79899 Other long term (current) drug therapy: Secondary | ICD-10-CM | POA: Diagnosis not present

## 2019-01-29 DIAGNOSIS — S32592A Other specified fracture of left pubis, initial encounter for closed fracture: Secondary | ICD-10-CM | POA: Diagnosis not present

## 2019-01-29 LAB — CBC WITH DIFFERENTIAL/PLATELET
Abs Immature Granulocytes: 0.07 10*3/uL (ref 0.00–0.07)
Basophils Absolute: 0 10*3/uL (ref 0.0–0.1)
Basophils Relative: 0 %
Eosinophils Absolute: 0 10*3/uL (ref 0.0–0.5)
Eosinophils Relative: 0 %
HCT: 32.7 % — ABNORMAL LOW (ref 36.0–46.0)
Hemoglobin: 9.9 g/dL — ABNORMAL LOW (ref 12.0–15.0)
Immature Granulocytes: 1 %
Lymphocytes Relative: 4 %
Lymphs Abs: 0.5 10*3/uL — ABNORMAL LOW (ref 0.7–4.0)
MCH: 24.4 pg — ABNORMAL LOW (ref 26.0–34.0)
MCHC: 30.3 g/dL (ref 30.0–36.0)
MCV: 80.7 fL (ref 80.0–100.0)
Monocytes Absolute: 1 10*3/uL (ref 0.1–1.0)
Monocytes Relative: 8 %
Neutro Abs: 11.1 10*3/uL — ABNORMAL HIGH (ref 1.7–7.7)
Neutrophils Relative %: 87 %
Platelets: 357 10*3/uL (ref 150–400)
RBC: 4.05 MIL/uL (ref 3.87–5.11)
RDW: 17.2 % — ABNORMAL HIGH (ref 11.5–15.5)
WBC: 12.7 10*3/uL — ABNORMAL HIGH (ref 4.0–10.5)
nRBC: 0 % (ref 0.0–0.2)

## 2019-01-29 LAB — BASIC METABOLIC PANEL
Anion gap: 9 (ref 5–15)
BUN: 23 mg/dL (ref 8–23)
CO2: 36 mmol/L — ABNORMAL HIGH (ref 22–32)
Calcium: 9.7 mg/dL (ref 8.9–10.3)
Chloride: 93 mmol/L — ABNORMAL LOW (ref 98–111)
Creatinine, Ser: 0.48 mg/dL (ref 0.44–1.00)
GFR calc Af Amer: >60 mL/min (ref >60)
GFR calc non Af Amer: >60 mL/min (ref >60)
Glucose, Bld: 117 mg/dL — ABNORMAL HIGH (ref 70–99)
Potassium: 3.7 mmol/L (ref 3.5–5.1)
Sodium: 138 mmol/L (ref 135–145)

## 2019-01-29 LAB — SARS CORONAVIRUS 2 (TAT 6-24 HRS): SARS Coronavirus 2: NEGATIVE

## 2019-01-29 MED ORDER — OXYCODONE HCL 5 MG PO TABS
5.0000 mg | ORAL_TABLET | Freq: Once | ORAL | Status: DC
  Filled 2019-01-29: qty 1

## 2019-01-29 MED ORDER — OXYCODONE HCL 5 MG PO TABS: 5.0000 mg | ORAL_TABLET | Freq: Four times a day (QID) | ORAL | 0 refills | Status: DC | PRN

## 2019-01-29 MED ORDER — OXYCODONE HCL 5 MG PO TABS: 5.0000 mg | ORAL_TABLET | Freq: Four times a day (QID) | ORAL | 0 refills | Status: AC | PRN

## 2019-01-29 MED ORDER — ACETAMINOPHEN 500 MG PO TABS
1000.0000 mg | ORAL_TABLET | Freq: Once | ORAL | Status: DC
  Filled 2019-01-29: qty 2

## 2019-01-29 NOTE — ED Notes (Signed)
[facility]  Called  For  transport 

## 2019-01-29 NOTE — ED Notes (Signed)
Pt returned from xray and CT scans.

## 2019-01-29 NOTE — ED Triage Notes (Signed)
Pt to ED via EMS from [place] c/o unwitnessed fall tonight, patient states was walking to kitchen and fell.  Pt c/o pain to left hip and leg.  Alert and oriented to baseline, wears 2L [place] chronic.

## 2019-01-29 NOTE — ED Notes (Signed)
Arrives with DNR.

## 2019-01-29 NOTE — ED Provider Notes (Signed)
[facility] Emergency Department Provider Note  ____________________________________________   First MD Initiated Contact with Patient 01/29/19 [phone]     (approximate)  I have reviewed the triage vital signs and the nursing notes.  History  Chief Complaint Fall    HPI Brenda Higgins is a 83 y.o. female with history of diastolic heart failure, COPD on 2 L nasal cannula, protein calorie malnutrition, extremely hard of hearing who presents emergency department from her assisted living facility for an unwitnessed fall.  She is unsure as to exactly why she fell, states that her leg gave out from under her.  She reports resultant left-sided hip and thigh pain.  She describes this as an aching, 8/10 in severity.  No radiation.  No numbness or tingling.  She is unsure if she hit her head due to the fall. Per last discharge summary, patient ambulates with a walker.    Past Medical Hx Past Medical History:  Diagnosis Date  . CHF (congestive heart failure) (HCC)   . Chronic respiratory failure (HCC)   . COPD (chronic obstructive pulmonary disease) (HCC)   . Hard of hearing   . Hyperlipidemia   . Hypertension     Problem List Patient Active Problem List   Diagnosis Date Noted  . Protein-calorie malnutrition, severe 01/14/2019  . Weakness 01/10/2019  . COPD exacerbation (HCC) 09/08/2014  . Acute on chronic respiratory failure with hypoxia and hypercapnia (HCC) 09/08/2014  . Acute on chronic diastolic CHF (congestive heart failure), NYHA class 1 (HCC) 09/08/2014  . Diastolic CHF (HCC) 09/08/2014  . Hip fracture (HCC) 09/04/2014    Past Surgical Hx Past Surgical History:  Procedure Laterality Date  . HIP SURGERY Right   . INTRAMEDULLARY (IM) NAIL INTERTROCHANTERIC Left 09/05/2014   Procedure: INTRAMEDULLARY (IM) NAIL INTERTROCHANTRIC;  Surgeon: Kennedy Bucker, MD;  Location: [facility] ORS;  Service: Orthopedics;  Laterality: Left;    Medications Prior to Admission  medications   Medication Sig Start Date End Date Taking? Authorizing Provider  albuterol (PROVENTIL HFA) 108 (90 Base) MCG/ACT inhaler Inhale into the lungs.    [provider]  ALPRAZolam Prudy Feeler) 0.25 MG tablet Take 1 tablet (0.25 mg total) by mouth every 6 (six) hours as needed for anxiety. 01/15/19   Hollice Espy, MD  amLODipine (NORVASC) 5 MG tablet Take 5 mg by mouth daily.    [provider]  ANORO ELLIPTA 62.5-25 MCG/INH AEPB INHALE 1 INHALATION INTO THE LUNGS ONCE DAILY. DO NOT USE WITH SPIRIVA OR ADVAIR 05/06/17   [provider]  aspirin EC 81 MG tablet Take 81 mg by mouth daily.    [provider]  feeding supplement, ENSURE ENLIVE, (ENSURE ENLIVE) LIQD Take 237 mLs by mouth 2 (two) times daily between meals. 01/15/19   Hollice Espy, MD  furosemide (LASIX) 20 MG tablet Take 1 tablet (20 mg total) by mouth daily. 09/08/14   Milagros Loll, MD  hydrochlorothiazide (MICROZIDE) 12.5 MG capsule Take 12.5 mg by mouth daily.  02/24/17   [provider]  ipratropium-albuterol (DUONEB) 0.5-2.5 (3) MG/3ML SOLN Take 3 mLs by nebulization every 6 (six) hours as needed (Wheezing, Shortness of breath). 01/15/19   Hollice Espy, MD  lovastatin (MEVACOR) 40 MG tablet Take 40 mg by mouth daily.    [provider]  polyethylene glycol (MIRALAX / GLYCOLAX) 17 g packet Take 17 g by mouth daily as needed for mild constipation. 01/15/19   Hollice Espy, MD    Allergies  Levofloxacin and Amoxicillin-pot clavulanate  Family Hx Family History  Problem Relation Age of Onset  . Coronary artery disease Father        And mother    Social Hx Social History   Tobacco Use  . Smoking status: Current Every Day Smoker    Packs/day: 0.50    Types: Cigarettes  . Smokeless tobacco: Never Used  Substance Use Topics  . Alcohol use: No  . Drug use: No     Review of Systems  Constitutional: Negative for fever, chills. Eyes: Negative  for visual changes. ENT: Negative for sore throat. Cardiovascular: Negative for chest pain. Respiratory: Negative for shortness of breath. Gastrointestinal: Negative for nausea, vomiting.  Genitourinary: Negative for dysuria. Musculoskeletal: + L hip and thigh pain Skin: Negative for rash. Neurological: Negative for for headaches.   Physical Exam  Vital Signs: ED Triage Vitals  Enc Vitals Group     BP 01/29/19 0330 110/68     Pulse Rate 01/29/19 0336 78     Resp 01/29/19 0336 (!) 21     Temp 01/29/19 0336 98.4 F (36.9 C)     Temp Source 01/29/19 0336 Oral     SpO2 01/29/19 0336 100 %     Weight 01/29/19 0331 78 lb 11.3 oz (35.7 kg)     Height 01/29/19 0331 4\' 6"  (1.372 m)     Head Circumference --      Peak Flow --      Pain Score 01/29/19 0330 8     Pain Loc --      Pain Edu? --      Excl. in GC? --     Constitutional: Alert and oriented.  Head: Normocephalic. Atraumatic. Eyes: Conjunctivae clear. Sclera anicteric. Nose: No congestion. No rhinorrhea. Mouth/Throat: Wearing mask.  Neck: No stridor.  No midline tenderness. Cardiovascular: Normal rate, regular rhythm. Extremities well perfused. Respiratory: Normal respiratory effort.  On baseline 2 L nasal cannula.  Chest wall is stable, nontender to palpation. Gastrointestinal: Soft. Non-tender. Non-distended.  Musculoskeletal: TTP about the left hip.  ROM deferred secondary to known discomfort. FROM to bilateral shoulders, elbows, wrists, as well as the RIGHT hip, knee, ankle. Back: No midline C/T/L-spine tenderness.  Significant kyphosis. Neurologic:  Normal speech and language. No gross focal neurologic deficits are appreciated.  Skin: Skin is warm, dry and intact. No rash noted. Psychiatric: Mood and affect are appropriate for situation.  EKG  Personally reviewed.   Rate: 87 Rhythm: sinus Axis: normal Intervals: WNL No acute ischemic changes No STEMI    Radiology  XR LEFT hip: IMPRESSION:  1.  Minimally displaced left inferior and likely nondisplaced  superior pubic rami fractures.  2. Intramedullary rod with trans trochanteric screw fixation of both  proximal femurs. Hardware is intact.   XR LEFT femur:  IMPRESSION:  1. No left femur fracture. Intact proximal femoral hardware.  2. Left pubic rami fractures better assessed on concurrent hip exam.   CT Head: IMPRESSION:  1. No evidence of intracranial injury.  2. Aging brain.     Procedures  Procedure(s) performed (including critical care):  Procedures   Initial Impression / Assessment and Plan / ED Course  83 y.o. female who presents to the ED for a fall w/ resultant LEFT hip pain, as above.   Imaging reveals superior and inferior pubic rami fractures on the left.   Will attempt pain control and see if she is able to ambulate, if so, she can continue physical therapy at her  facility. If not, will need to admit for pain control, PT/OT.   Final Clinical Impression(s) / ED Diagnosis  Final diagnoses:  Fall, initial encounter  Left hip pain  Inferior pubic ramus fracture, left, closed, initial encounter ([facility])  Closed fracture of superior ramus of left pubis, initial encounter [facility])       Note:  This document was prepared using Dragon voice recognition software and may include unintentional dictation errors.   Lilia Pro., MD 01/29/19 [phone]

## 2019-01-29 NOTE — ED Notes (Addendum)
This RN attempted to ambulate patient.  Pt is able to bear weight to bilateral lower extremities and is able to take a few steps with one person assist.  Pain continues to left hip but does not appear to be debilitating at this time.  EDP, [place] notified.

## 2019-01-29 NOTE — Discharge Instructions (Addendum)
Patient will require physical therapy for nonoperative pelvic fractures.  She is allowed to bear weight as tolerated

## 2019-01-29 NOTE — ED Notes (Signed)
This RN has spoke with [employer] and patients son; neither of which are able to provide transportation back to facility.  Secretary notified to arrange for EMS transportation.

## 2019-12-23 DEATH — deceased

## 2020-07-28 IMAGING — US US EXTREM LOW VENOUS*L*
1 series · 14 of 24 positions shown · non-contrast
Comparison: 09/04/2014

CLINICAL DATA: Fell, weeping edema

EXAM:
LEFT LOWER EXTREMITY VENOUS DOPPLER ULTRASOUND
TECHNIQUE: Gray-scale sonography with compression, as well as color and duplex
ultrasound, were performed to evaluate the deep venous system from
the level of the common femoral vein through the popliteal and
proximal calf veins.

[Series 1: us extrem low venous*left* · 14 of 35 slices shown]
[im 1/35]
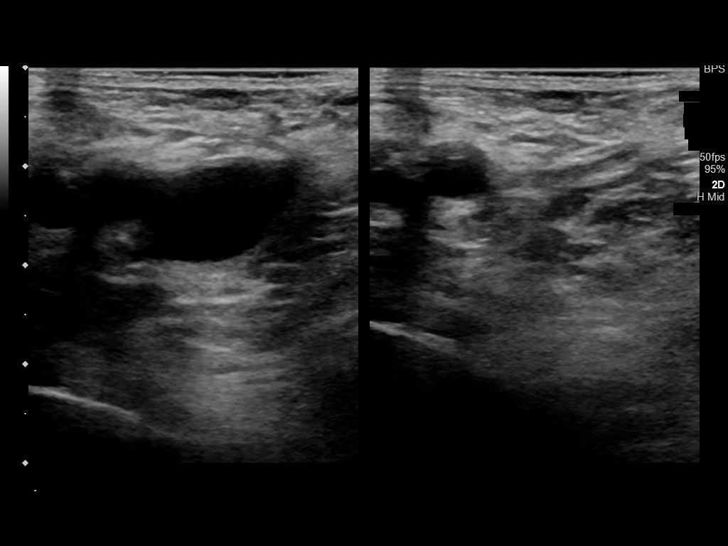
[im 3/35]
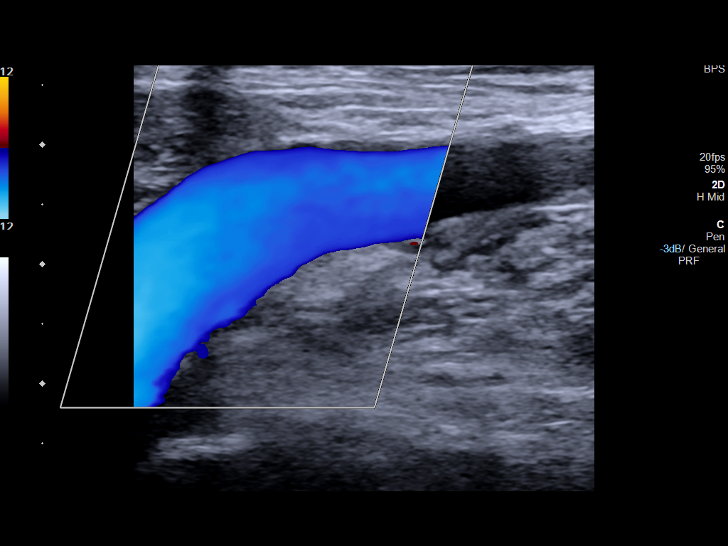
[im 6/35]
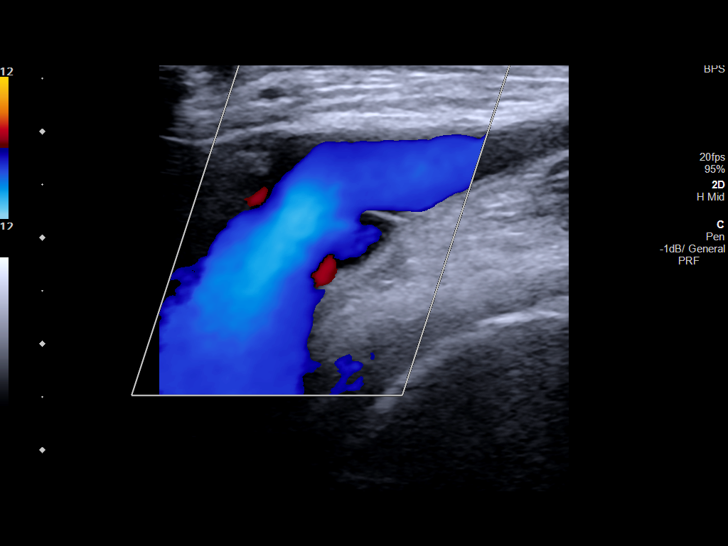
[im 9/35]
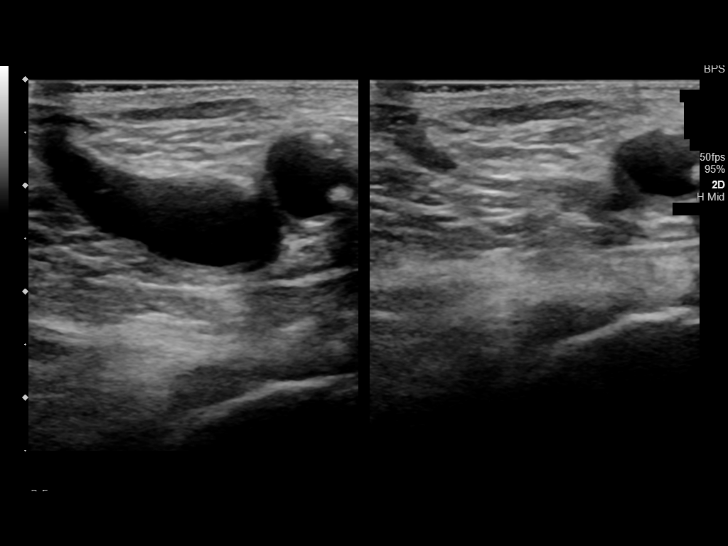
[im 11/35]
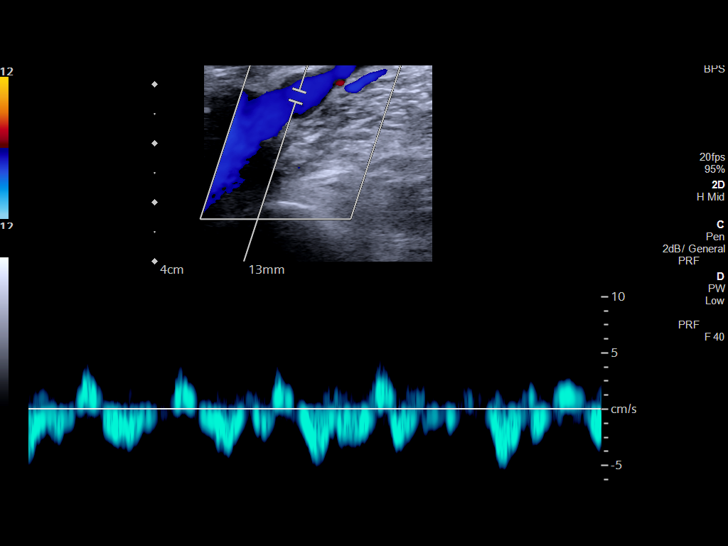
[im 14/35]
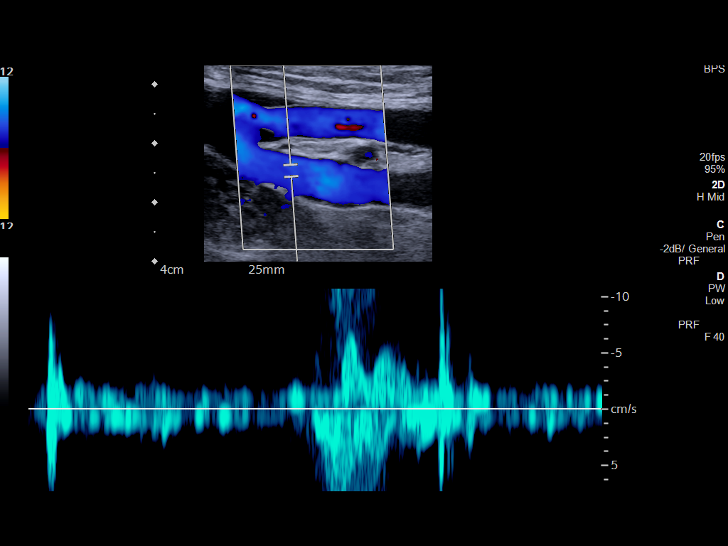
[im 17/35]
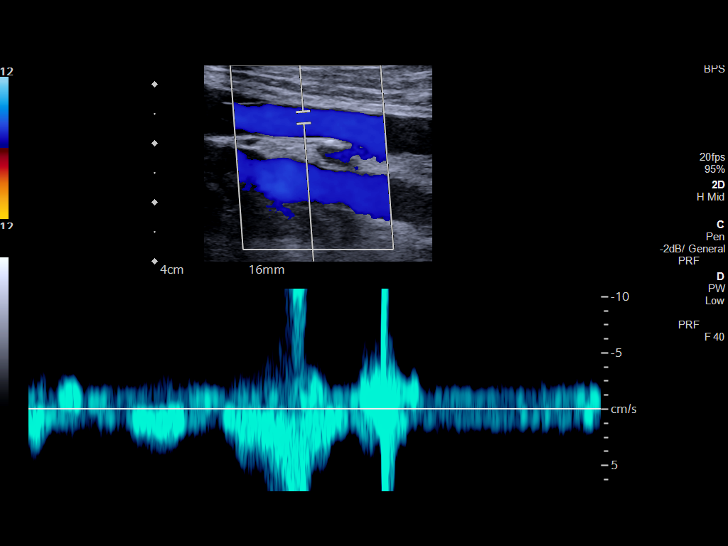
[im 18/35]
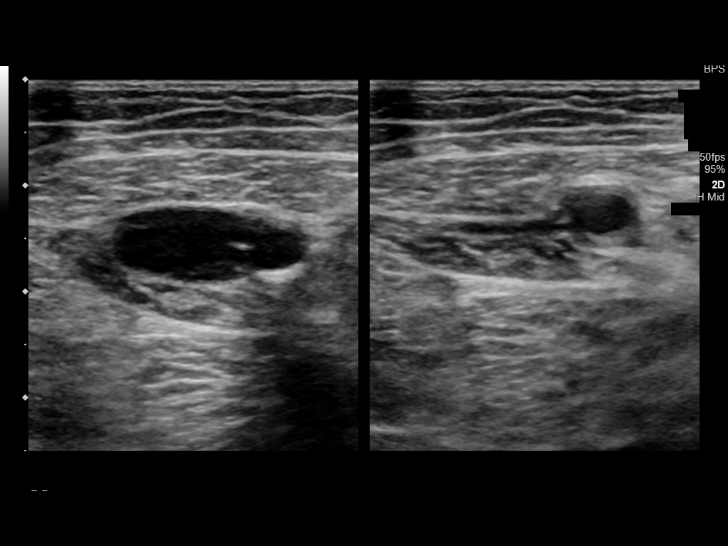
[im 21/35]
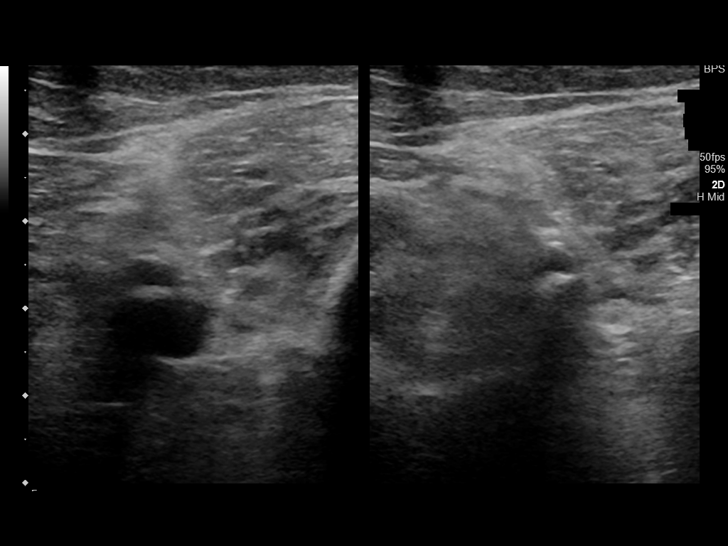
[im 24/35]
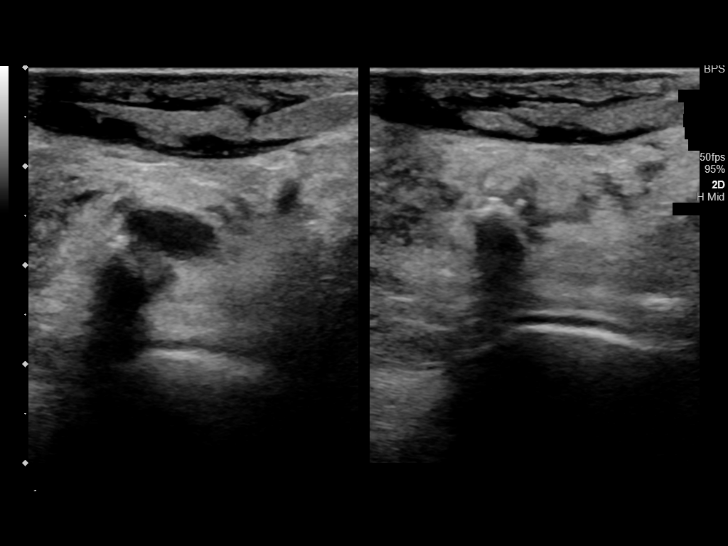
[im 27/35]
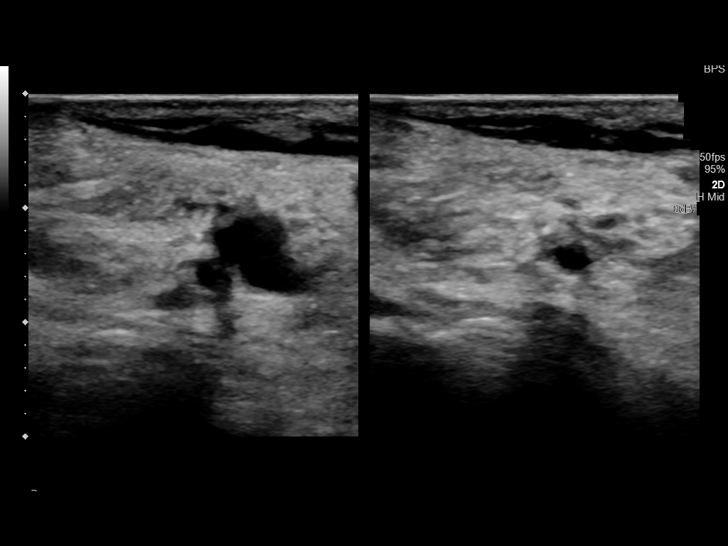
[im 29/35]
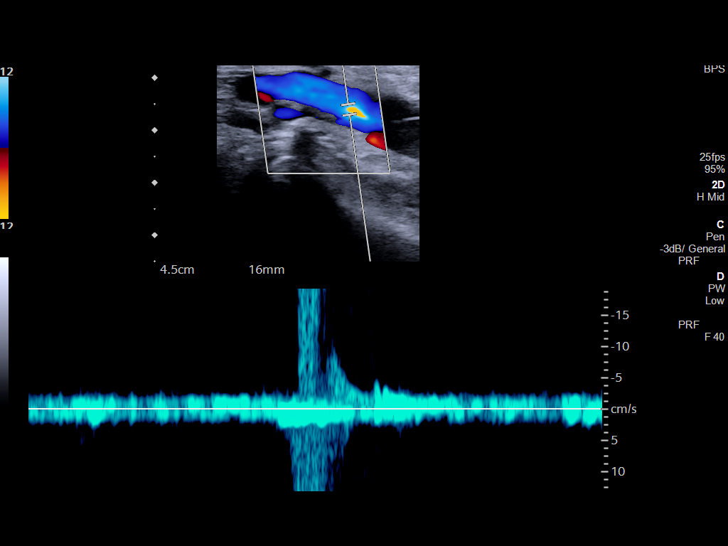
[im 32/35]
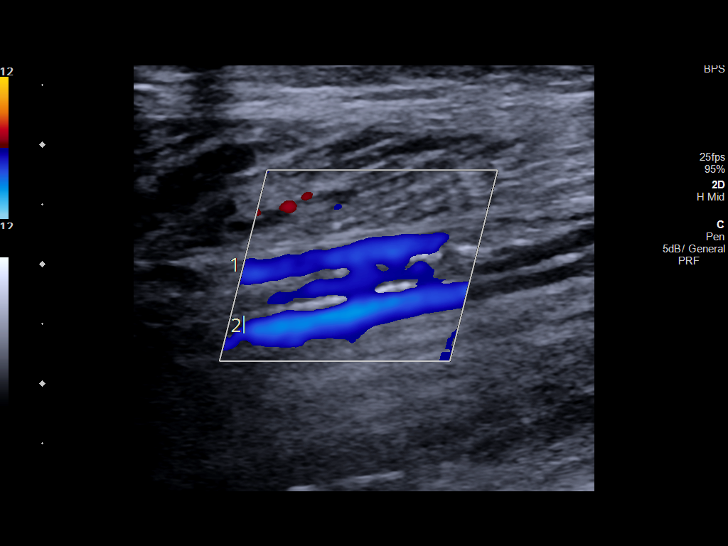
[im 35/35]
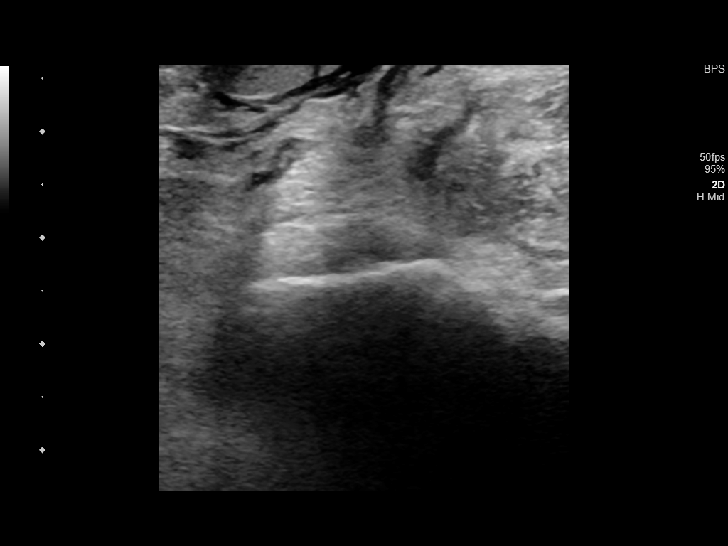

[14 of 24 positions shown; findings below may reference images not displayed]

FINDINGS: Normal compressibility of the common femoral, superficial femoral,
and popliteal veins, as well as the proximal calf veins. No filling
defects to suggest DVT on grayscale or color Doppler imaging.
Doppler waveforms show normal direction of venous flow, normal
respiratory phasicity and response to augmentation.

Subcutaneous edema in the calf.

Survey views of the contralateral common femoral vein are
unremarkable.
IMPRESSION: No femoropopliteal and no calf DVT in the visualized calf veins. If
clinical symptoms are inconsistent or if there are persistent or
worsening symptoms, further imaging (possibly involving the iliac
veins) may be warranted.

## 2020-07-28 IMAGING — DX DG TIBIA/FIBULA 2V*L*
3 series · 3 of 3 positions shown · non-contrast
Comparison: None.

CLINICAL DATA: Fall, weeping edema to the left leg

EXAM:
LEFT TIBIA AND FIBULA - 2 VIEW

[tibia ap]
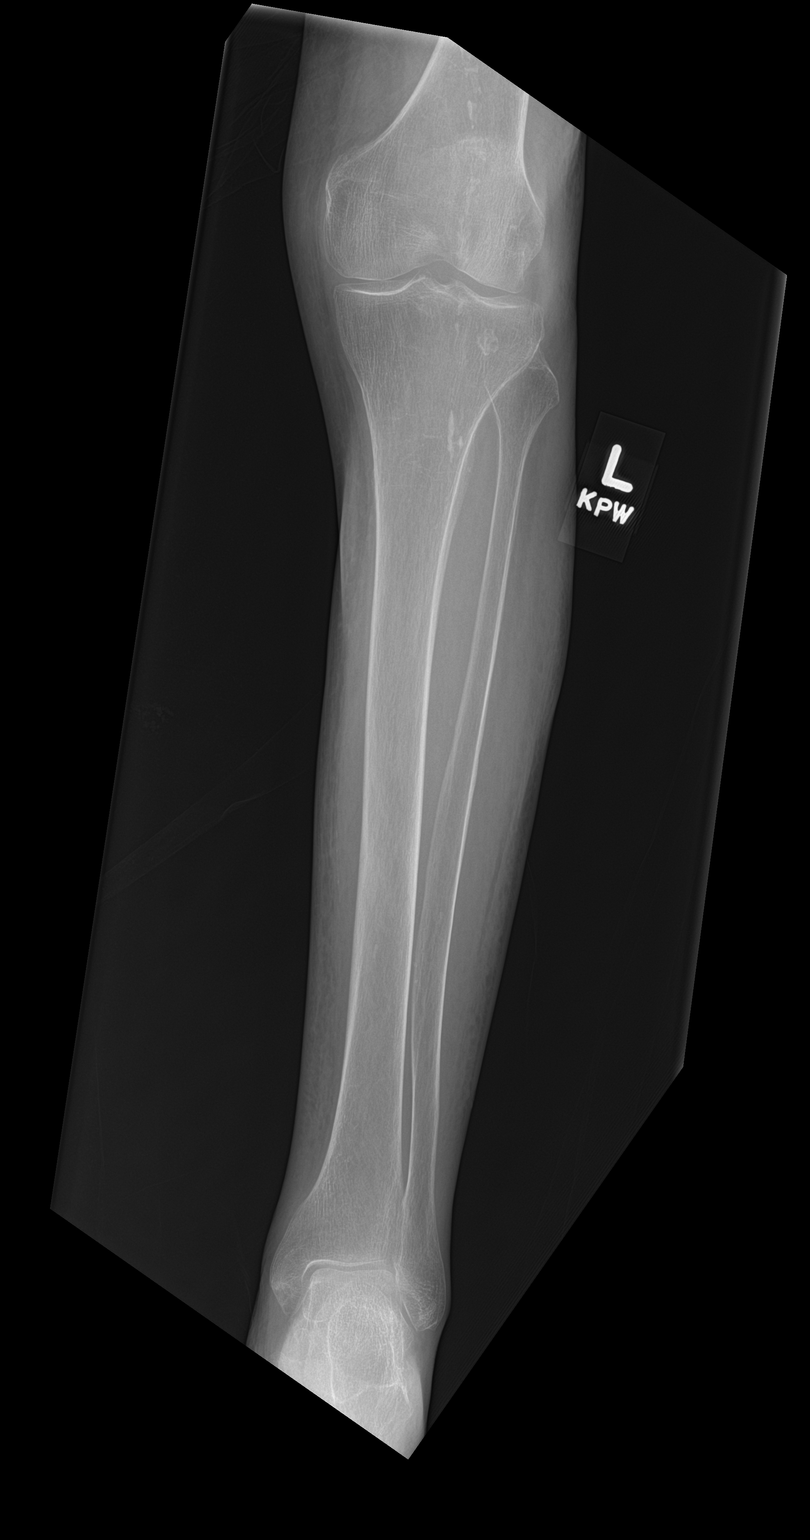

[tibia lat (1 of 2)]
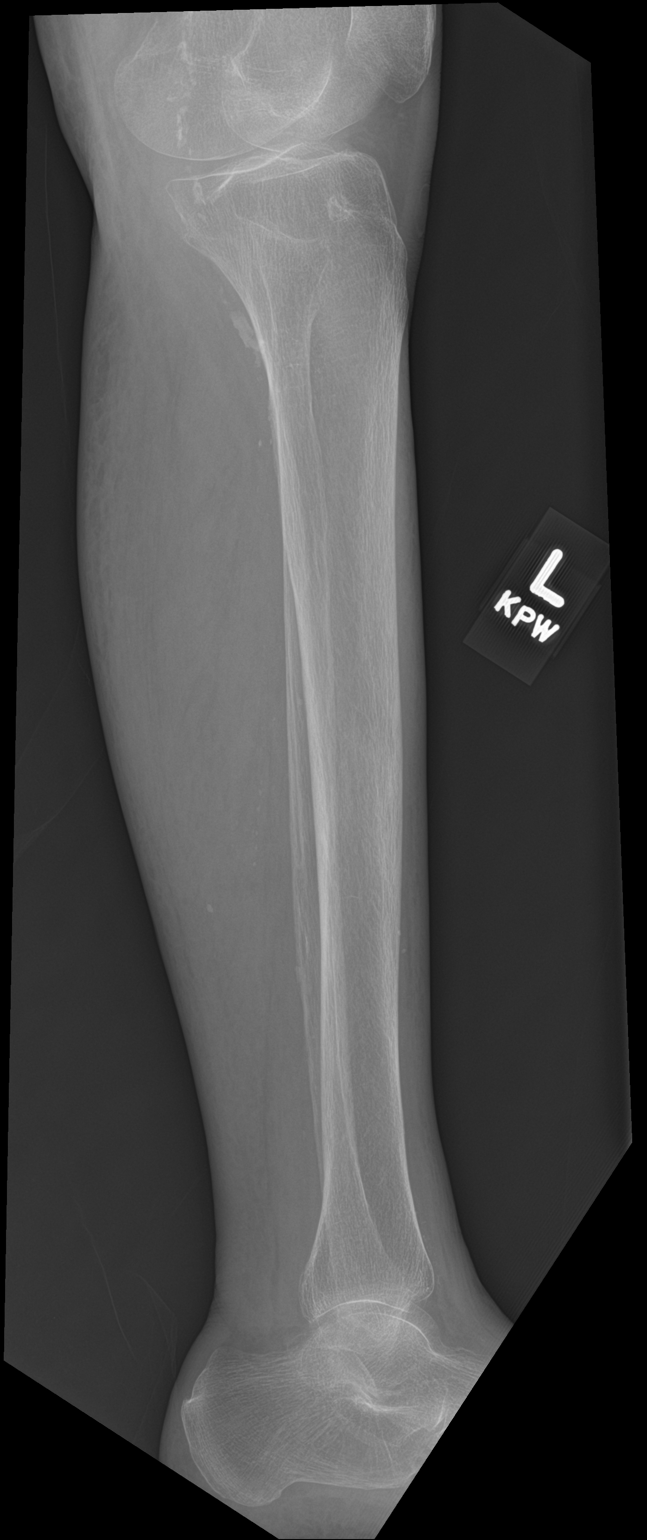

[tibia lat (2 of 2)]
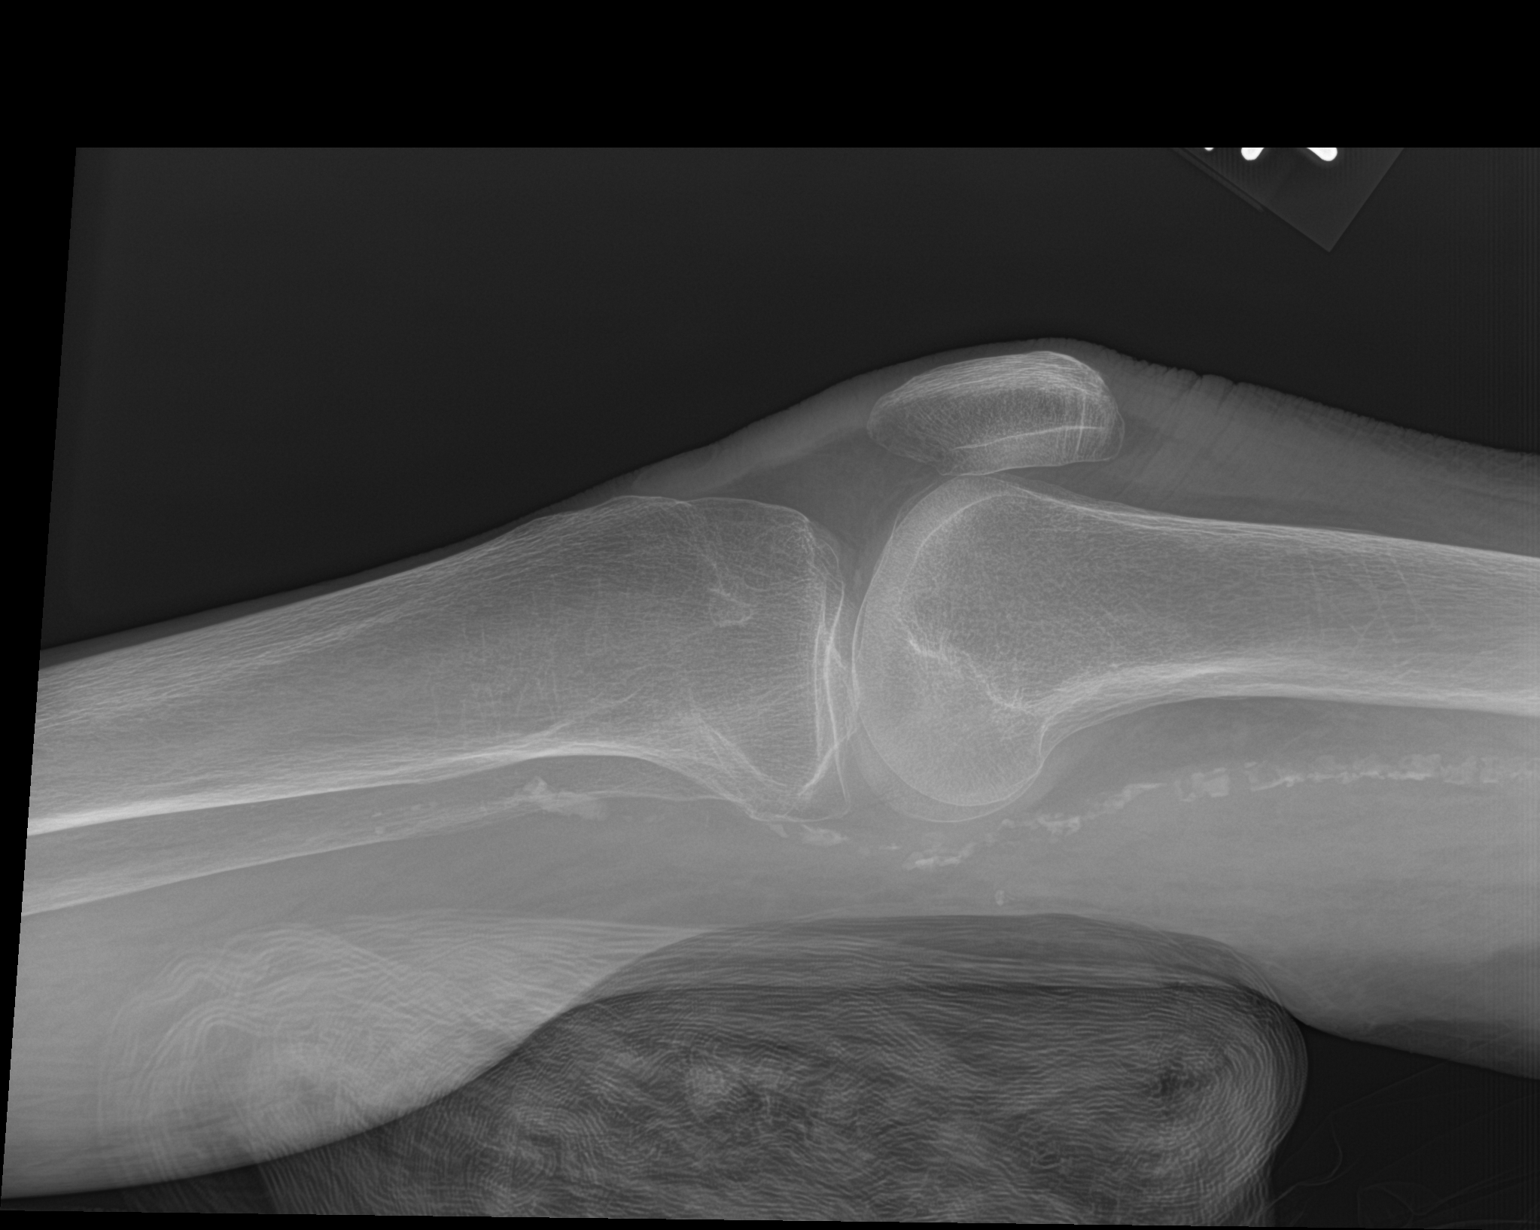

[3 of 3 positions shown; findings below may reference images not displayed]

FINDINGS: The osseous structures appear diffusely demineralized which may
limit detection of small or nondisplaced fractures. No acute bony
abnormality. Specifically, no fracture, subluxation, or dislocation.
Fairly well-defined sclerotic/lucent lesion in the lateral tibial
plateau with a relatively narrow zone of transition. Diffuse
edematous soft tissue swelling of the leg. Vascular calcium noted
posteriorly. Mild arthrosis in the knee and ankle.
IMPRESSION: No acute osseous abnormality.

Benign appearing sclerotic/lucent lesion in the lateral tibial
plateau.

Mild degenerative changes of the knee and ankle.

Diffuse lower leg edema.
